# Patient Record
Sex: Female | Born: 1960
Health system: Southern US, Community
[De-identification: ages and names within clinical notes are randomized; demographics above are authoritative.]

## PROBLEM LIST (undated history)

## (undated) DIAGNOSIS — Z9889 Other specified postprocedural states: Secondary | ICD-10-CM

## (undated) DIAGNOSIS — M1990 Unspecified osteoarthritis, unspecified site: Secondary | ICD-10-CM

## (undated) DIAGNOSIS — R112 Nausea with vomiting, unspecified: Secondary | ICD-10-CM

## (undated) DIAGNOSIS — IMO0002 Reserved for concepts with insufficient information to code with codable children: Secondary | ICD-10-CM

## (undated) DIAGNOSIS — Z789 Other specified health status: Secondary | ICD-10-CM

## (undated) HISTORY — PX: BREAST SURGERY: SHX581

## (undated) HISTORY — PX: COLONOSCOPY: SHX174

---

## 1999-05-30 ENCOUNTER — Other Ambulatory Visit: Admission: RE | Admit: 1999-05-30 | Discharge: 1999-05-30 | Payer: Self-pay | Admitting: Gynecology

## 2000-08-10 ENCOUNTER — Other Ambulatory Visit: Admission: RE | Admit: 2000-08-10 | Discharge: 2000-08-10 | Payer: Self-pay | Admitting: Gynecology

## 2001-08-04 ENCOUNTER — Other Ambulatory Visit: Admission: RE | Admit: 2001-08-04 | Discharge: 2001-08-04 | Payer: Self-pay | Admitting: Gynecology

## 2002-12-15 ENCOUNTER — Other Ambulatory Visit: Admission: RE | Admit: 2002-12-15 | Discharge: 2002-12-15 | Payer: Self-pay | Admitting: Gynecology

## 2003-06-20 ENCOUNTER — Emergency Department (HOSPITAL_COMMUNITY): Admission: EM | Admit: 2003-06-20 | Discharge: 2003-06-20 | Payer: Self-pay | Admitting: Emergency Medicine

## 2003-09-04 ENCOUNTER — Encounter: Admission: RE | Admit: 2003-09-04 | Discharge: 2003-09-04 | Payer: Self-pay | Admitting: Gynecology

## 2004-06-25 ENCOUNTER — Other Ambulatory Visit: Admission: RE | Admit: 2004-06-25 | Discharge: 2004-06-25 | Payer: Self-pay | Admitting: Gynecology

## 2005-07-11 ENCOUNTER — Other Ambulatory Visit: Admission: RE | Admit: 2005-07-11 | Discharge: 2005-07-11 | Payer: Self-pay | Admitting: Gynecology

## 2005-12-09 ENCOUNTER — Encounter: Admission: RE | Admit: 2005-12-09 | Discharge: 2005-12-09 | Payer: Self-pay | Admitting: Gynecology

## 2006-09-15 ENCOUNTER — Other Ambulatory Visit: Admission: RE | Admit: 2006-09-15 | Discharge: 2006-09-15 | Payer: Self-pay | Admitting: Gynecology

## 2007-01-06 ENCOUNTER — Encounter: Admission: RE | Admit: 2007-01-06 | Discharge: 2007-01-06 | Payer: Self-pay | Admitting: Gynecology

## 2007-02-01 ENCOUNTER — Emergency Department (HOSPITAL_COMMUNITY): Admission: EM | Admit: 2007-02-01 | Discharge: 2007-02-02 | Payer: Self-pay | Admitting: Emergency Medicine

## 2008-04-19 ENCOUNTER — Encounter: Admission: RE | Admit: 2008-04-19 | Discharge: 2008-04-19 | Payer: Self-pay | Admitting: Gynecology

## 2008-04-26 ENCOUNTER — Encounter: Admission: RE | Admit: 2008-04-26 | Discharge: 2008-04-26 | Payer: Self-pay | Admitting: Gynecology

## 2008-10-23 ENCOUNTER — Encounter: Admission: RE | Admit: 2008-10-23 | Discharge: 2008-10-23 | Payer: Self-pay | Admitting: Gynecology

## 2009-01-01 ENCOUNTER — Ambulatory Visit: Payer: Self-pay | Admitting: Vascular Surgery

## 2009-01-16 ENCOUNTER — Ambulatory Visit: Payer: Self-pay | Admitting: Vascular Surgery

## 2009-06-21 ENCOUNTER — Encounter: Admission: RE | Admit: 2009-06-21 | Discharge: 2009-06-21 | Payer: Self-pay | Admitting: Gynecology

## 2010-06-24 ENCOUNTER — Encounter: Admission: RE | Admit: 2010-06-24 | Discharge: 2010-06-24 | Payer: Self-pay | Admitting: Gynecology

## 2010-09-08 HISTORY — PX: CERVICAL FUSION: SHX112

## 2010-09-29 ENCOUNTER — Encounter: Payer: Self-pay | Admitting: Gynecology

## 2010-10-14 ENCOUNTER — Encounter (HOSPITAL_COMMUNITY)
Admission: RE | Admit: 2010-10-14 | Discharge: 2010-10-14 | Disposition: A | Discharge status: Home / Self Care | Payer: 59 | Source: Ambulatory Visit | Attending: Orthopaedic Surgery | Admitting: Orthopaedic Surgery

## 2010-10-14 ENCOUNTER — Ambulatory Visit (HOSPITAL_COMMUNITY)
Admission: RE | Admit: 2010-10-14 | Discharge: 2010-10-14 | Disposition: A | Discharge status: Home / Self Care | Payer: 59 | Source: Ambulatory Visit | Attending: Orthopaedic Surgery | Admitting: Orthopaedic Surgery

## 2010-10-14 ENCOUNTER — Other Ambulatory Visit (HOSPITAL_COMMUNITY): Payer: Self-pay | Admitting: Orthopaedic Surgery

## 2010-10-14 DIAGNOSIS — M47812 Spondylosis without myelopathy or radiculopathy, cervical region: Secondary | ICD-10-CM | POA: Insufficient documentation

## 2010-10-14 DIAGNOSIS — Z01811 Encounter for preprocedural respiratory examination: Secondary | ICD-10-CM | POA: Insufficient documentation

## 2010-10-14 LAB — COMPREHENSIVE METABOLIC PANEL
ALT: 28 U/L (ref 0–35)
AST: 22 U/L (ref 0–37)
Albumin: 4.1 g/dL (ref 3.5–5.2)
Alkaline Phosphatase: 44 U/L (ref 39–117)
BUN: 9 mg/dL (ref 6–23)
CO2: 28 mEq/L (ref 19–32)
Calcium: 9.4 mg/dL (ref 8.4–10.5)
Chloride: 110 mEq/L (ref 96–112)
Creatinine, Ser: 0.75 mg/dL (ref 0.4–1.2)
GFR calc Af Amer: >60 mL/min (ref >60)
GFR calc non Af Amer: >60 mL/min (ref >60)
Glucose, Bld: 100 mg/dL — ABNORMAL HIGH (ref 70–99)
Potassium: 4.6 mEq/L (ref 3.5–5.1)
Sodium: 143 mEq/L (ref 135–145)
Total Bilirubin: 0.2 mg/dL — ABNORMAL LOW (ref 0.3–1.2)
Total Protein: 6.8 g/dL (ref 6.0–8.3)

## 2010-10-14 LAB — CBC
HCT: 39.7 % (ref 36.0–46.0)
Hemoglobin: 14.2 g/dL (ref 12.0–15.0)
MCH: 32.4 pg (ref 26.0–34.0)
MCHC: 35.8 g/dL (ref 30.0–36.0)
MCV: 90.6 fL (ref 78.0–100.0)
Platelets: 207 10*3/uL (ref 150–400)
RBC: 4.38 MIL/uL (ref 3.87–5.11)
RDW: 11.2 % — ABNORMAL LOW (ref 11.5–15.5)
WBC: 3.8 10*3/uL — ABNORMAL LOW (ref 4.0–10.5)

## 2010-10-14 LAB — DIFFERENTIAL
Basophils Absolute: 0 10*3/uL (ref 0.0–0.1)
Basophils Relative: 1 % (ref 0–1)
Eosinophils Absolute: 0.1 10*3/uL (ref 0.0–0.7)
Eosinophils Relative: 3 % (ref 0–5)
Lymphocytes Relative: 52 % — ABNORMAL HIGH (ref 12–46)
Lymphs Abs: 2 10*3/uL (ref 0.7–4.0)
Monocytes Absolute: 0.3 10*3/uL (ref 0.1–1.0)
Monocytes Relative: 7 % (ref 3–12)
Neutro Abs: 1.5 10*3/uL — ABNORMAL LOW (ref 1.7–7.7)
Neutrophils Relative %: 38 % — ABNORMAL LOW (ref 43–77)

## 2010-10-14 LAB — SURGICAL PCR SCREEN
MRSA, PCR: NEGATIVE
Staphylococcus aureus: NEGATIVE

## 2010-10-14 LAB — PROTIME-INR
INR: 0.89 (ref 0.00–1.49)
Prothrombin Time: 12.3 seconds (ref 11.6–15.2)

## 2010-10-16 ENCOUNTER — Ambulatory Visit (HOSPITAL_COMMUNITY): Payer: 59

## 2010-10-16 ENCOUNTER — Observation Stay (HOSPITAL_COMMUNITY)
Admission: RE | Admit: 2010-10-16 | Discharge: 2010-10-17 | Disposition: A | Discharge status: Home / Self Care | Payer: 59 | Source: Ambulatory Visit | Attending: Orthopaedic Surgery | Admitting: Orthopaedic Surgery

## 2010-10-16 DIAGNOSIS — Z87891 Personal history of nicotine dependence: Secondary | ICD-10-CM | POA: Insufficient documentation

## 2010-10-16 DIAGNOSIS — M47812 Spondylosis without myelopathy or radiculopathy, cervical region: Principal | ICD-10-CM | POA: Insufficient documentation

## 2010-10-16 DIAGNOSIS — F411 Generalized anxiety disorder: Secondary | ICD-10-CM | POA: Insufficient documentation

## 2010-10-16 DIAGNOSIS — Z01818 Encounter for other preprocedural examination: Secondary | ICD-10-CM | POA: Insufficient documentation

## 2010-10-22 NOTE — Op Note (Signed)
NAME:  Kathy Mcdaniel, Kathy Mcdaniel NO.:  [id number]  MEDICAL RECORD NO.:  [id number]           PATIENT TYPE:  I  LOCATION:  5002                         FACILITY:  [facility]  PHYSICIAN:  Mark C. Ophelia Charter, M.D.    DATE OF BIRTH:  21-Jun-1961  DATE OF PROCEDURE:  10/16/2010 DATE OF DISCHARGE:                              OPERATIVE REPORT   PREOPERATIVE DIAGNOSIS:  Cervical spondylosis with disk protrusion at C4- 5, C5-6.  POSTOPERATIVE DIAGNOSIS:  Cervical spondylosis with disk protrusion at C4-5, C5-6.  PROCEDURE:  C4-5, C5-6 anterior cervical diskectomy and fusion, allograft and plate.  ASSISTANT:  Maud Deed, PA-C  ANESTHESIA:  GOT plus Marcaine local.  DRAINS:  One Hemovac neck.  PROCEDURE:  After induction of general anesthesia orotracheal intubation, standard prepping and draping with horseshoe head halter traction without weight, DuraPrep area squared with towels, sterile skin marker, Betadine Steri-Strips, thyroid sheets and drapes was in the sterile Mayo standard head.  Time-out procedure was completed.  Incision was made starting at the midline and extending to the left, platysma fibers were divided in line with the skin incision.  Blunt dissection down longus coli muscles was performed and short 25 needle placed at the C5-6 interspace confirming that was appropriate level.  Self-retaining Cloward retractors were placed.  Sharp blades right and left, smooth blades up and down, diskectomy was performed with 15 scalpel blade Cloward curettes.  Operative microscope was draped and brought in and disk was removed back to the posterior longitudinal ligament.  Endplates were curetted, bur was used briefly.  There was some hypermobility as the disk was removed.  The anterior aspect of the disk space wanted to collapse.  It was held open with a T elevator, posterior ligaments were removed.  Dura was decompressed.  There was spurs overhanging more on the left side and  some disk extrusion slightly underneath the C6 vertebrate on the left side.  Uncovertebral joints were stripped sizing showed a 6-mm lordotic graft fit appropriately and it was inserted.  Self-retaining blades were removed at the C4-5 level.  The procedure was repeated.  Similarly disk protrusion at this level ligaments removed, spurs more on the left than right, where she had foraminal narrowing. Sizing showed a 7-mm lordotic graft fit appropriately.  The patient had lax neck and despite positioning of the head holder back, she maintained slight kyphosis similar to preoperative x-ray.  Some extra lordosis was then into the plate to help with this and some additional spurs removed off the C6 vertebrate so the plate would sit down after bent.  Fluffs were inserted and had been marked in the midline and plate was placed, checked under fluoroscopy carefully adjusted into a center screws were sequentially tightened down alternating to made with right down flat then insertion with the screws above and below.  The spinal spot pictures were taken for documentation.  Hemovac was placed in-and- out technique in line with skin incision.  The wound was closed with 3-0 on the platysma and 4-0 subcuticular closure.  Tincture of Benzoin, Steri-Strips, 4x4's, tape, and soft cervical collar.  A time-out procedure was  complete at the end of the case.     Mark C. Ophelia Charter, M.D.     MCY/MEDQ  D:  10/16/2010  T:  10/17/2010  Job:  [id number]  Electronically Signed by Annell Greening M.D. on 10/22/2010 04:59:11 PM

## 2011-01-21 NOTE — Consult Note (Signed)
NEW PATIENT CONSULTATION   Kathy Mcdaniel, Kathy Mcdaniel  DOB:  24-Dec-1960                                       01/01/2009  CHART#:[id number]   The patient is a 50 year old female referred for evaluation of spider  vein treatment.  She has had previous treatments for spider veins in the  past but has had recurrence.  She has some aching discomfort in both  lower legs and distal thighs which has been present over the last few  years but worsened.  She has no history of swelling, bleeding,  ulceration, thrombophlebitis, deep venous thrombosis but has had some  previous sclerotherapy performed in these spider veins with acceptable  results.  She does not wear elastic compression stockings, elevate the  legs or take pain medication.   PAST MEDICAL HISTORY:  Negative for diabetes, hypertension, coronary  artery disease, COPD or stroke.  She does have a history of herniated  disk her neck followed by Dr. Annell Greening.   PAST SURGICAL HISTORY:  Removal of a cyst in the left breast.   FAMILY HISTORY:  Unremarkable, with negative for coronary artery  disease, diabetes or stroke.   SOCIAL HISTORY:  She is married and works as an Designer, television/film set.  Quit smoking  in May 2008.  Does not use alcohol.   REVIEW OF SYSTEMS:  Unremarkable with the exception of chronic  constipation, joint pain and muscle pain.   ALLERGIES:  Ibuprofen, shellfish.   PHYSICAL EXAMINATION:  Vital Signs:  Blood pressure is 112/72, heart  rate 79, respirations 14, carotid pulses are 3+.  She is alert and  oriented x3.  Neck:  No palpable adenopathy in the neck.  Chest:  Clear  to auscultation.  Cardiovascular:  Regular rhythm, no murmurs.  Abdomen:  Soft, nontender with no masses.  She has 3+ femoral, popliteal and  dorsalis pedis pulses bilaterally.  No distal edema or evidence of  chronic venous insufficiency.  No hyperpigmentation or ulceration noted.  She does have some prominent spider veins in the right thigh  medially,  laterally and anteriorly and a few on the medial thigh on the left.  No  varicosities are noted adjacent to these.   I think the best treatment would be primary sclerotherapy for these  spider veins and we will schedule her to be done in the near future at  her request.   Fayrene Fearing D. Hart Rochester, M.D.  Electronically Signed   JDL/MEDQ  D:  01/01/2009  T:  01/02/2009  Job:  [id number]

## 2011-06-27 ENCOUNTER — Other Ambulatory Visit: Payer: Self-pay | Admitting: Gynecology

## 2011-06-27 DIAGNOSIS — Z1231 Encounter for screening mammogram for malignant neoplasm of breast: Secondary | ICD-10-CM

## 2011-08-06 ENCOUNTER — Ambulatory Visit
Admission: RE | Admit: 2011-08-06 | Discharge: 2011-08-06 | Disposition: A | Discharge status: Home / Self Care | Payer: 59 | Source: Ambulatory Visit | Attending: Gynecology | Admitting: Gynecology

## 2011-08-06 DIAGNOSIS — Z1231 Encounter for screening mammogram for malignant neoplasm of breast: Secondary | ICD-10-CM

## 2012-06-29 ENCOUNTER — Other Ambulatory Visit: Payer: Self-pay | Admitting: Gynecology

## 2012-06-29 DIAGNOSIS — Z1231 Encounter for screening mammogram for malignant neoplasm of breast: Secondary | ICD-10-CM

## 2012-08-10 ENCOUNTER — Ambulatory Visit
Admission: RE | Admit: 2012-08-10 | Discharge: 2012-08-10 | Disposition: A | Discharge status: Home / Self Care | Payer: 59 | Source: Ambulatory Visit | Attending: Gynecology | Admitting: Gynecology

## 2012-08-10 DIAGNOSIS — Z1231 Encounter for screening mammogram for malignant neoplasm of breast: Secondary | ICD-10-CM

## 2012-08-11 ENCOUNTER — Other Ambulatory Visit: Payer: Self-pay | Admitting: Gynecology

## 2012-08-11 DIAGNOSIS — R928 Other abnormal and inconclusive findings on diagnostic imaging of breast: Secondary | ICD-10-CM

## 2012-08-25 ENCOUNTER — Ambulatory Visit
Admission: RE | Admit: 2012-08-25 | Discharge: 2012-08-25 | Disposition: A | Discharge status: Home / Self Care | Payer: 59 | Source: Ambulatory Visit | Attending: Gynecology | Admitting: Gynecology

## 2012-08-25 DIAGNOSIS — R928 Other abnormal and inconclusive findings on diagnostic imaging of breast: Secondary | ICD-10-CM

## 2013-03-08 HISTORY — PX: CARPAL TUNNEL WITH CUBITAL TUNNEL: SHX5608

## 2013-08-19 ENCOUNTER — Other Ambulatory Visit: Payer: Self-pay | Admitting: Family Medicine

## 2013-08-19 ENCOUNTER — Other Ambulatory Visit (HOSPITAL_COMMUNITY)
Admission: RE | Admit: 2013-08-19 | Discharge: 2013-08-19 | Disposition: A | Discharge status: Home / Self Care | Payer: 59 | Source: Ambulatory Visit | Attending: Family Medicine | Admitting: Family Medicine

## 2013-08-19 DIAGNOSIS — Z124 Encounter for screening for malignant neoplasm of cervix: Secondary | ICD-10-CM | POA: Insufficient documentation

## 2014-01-16 ENCOUNTER — Other Ambulatory Visit: Payer: Self-pay | Admitting: Orthopedic Surgery

## 2014-02-17 ENCOUNTER — Encounter (HOSPITAL_BASED_OUTPATIENT_CLINIC_OR_DEPARTMENT_OTHER): Admission: RE | Payer: Self-pay | Source: Ambulatory Visit

## 2014-02-17 ENCOUNTER — Ambulatory Visit (HOSPITAL_BASED_OUTPATIENT_CLINIC_OR_DEPARTMENT_OTHER): Admission: RE | Admit: 2014-02-17 | Payer: 59 | Source: Ambulatory Visit | Admitting: Orthopedic Surgery

## 2014-02-17 SURGERY — RELEASE, CUBITAL TUNNEL
Anesthesia: General | Laterality: Right

## 2014-04-06 ENCOUNTER — Encounter (HOSPITAL_BASED_OUTPATIENT_CLINIC_OR_DEPARTMENT_OTHER): Payer: Self-pay | Admitting: *Deleted

## 2014-04-06 NOTE — Progress Notes (Signed)
No labs needed-had other arm done last yr

## 2014-04-13 ENCOUNTER — Ambulatory Visit (HOSPITAL_BASED_OUTPATIENT_CLINIC_OR_DEPARTMENT_OTHER)
Admission: RE | Admit: 2014-04-13 | Discharge: 2014-04-13 | Disposition: A | Discharge status: Home / Self Care | Payer: 59 | Source: Ambulatory Visit | Attending: Orthopedic Surgery | Admitting: Orthopedic Surgery

## 2014-04-13 ENCOUNTER — Encounter (HOSPITAL_BASED_OUTPATIENT_CLINIC_OR_DEPARTMENT_OTHER): Payer: Self-pay | Admitting: *Deleted

## 2014-04-13 ENCOUNTER — Ambulatory Visit (HOSPITAL_BASED_OUTPATIENT_CLINIC_OR_DEPARTMENT_OTHER): Payer: 59 | Admitting: Anesthesiology

## 2014-04-13 ENCOUNTER — Encounter (HOSPITAL_BASED_OUTPATIENT_CLINIC_OR_DEPARTMENT_OTHER)
Admission: RE | Disposition: A | Discharge status: Home / Self Care | Payer: Self-pay | Source: Ambulatory Visit | Attending: Orthopedic Surgery

## 2014-04-13 ENCOUNTER — Encounter (HOSPITAL_BASED_OUTPATIENT_CLINIC_OR_DEPARTMENT_OTHER): Payer: 59 | Admitting: Anesthesiology

## 2014-04-13 DIAGNOSIS — G5620 Lesion of ulnar nerve, unspecified upper limb: Secondary | ICD-10-CM | POA: Diagnosis not present

## 2014-04-13 DIAGNOSIS — Z91040 Latex allergy status: Secondary | ICD-10-CM | POA: Insufficient documentation

## 2014-04-13 DIAGNOSIS — Z886 Allergy status to analgesic agent status: Secondary | ICD-10-CM | POA: Insufficient documentation

## 2014-04-13 DIAGNOSIS — Z91013 Allergy to seafood: Secondary | ICD-10-CM | POA: Diagnosis not present

## 2014-04-13 HISTORY — DX: Unspecified osteoarthritis, unspecified site: M19.90

## 2014-04-13 HISTORY — DX: Reserved for concepts with insufficient information to code with codable children: IMO0002

## 2014-04-13 HISTORY — DX: Other specified health status: Z78.9

## 2014-04-13 HISTORY — PX: ULNAR TUNNEL RELEASE: SHX820

## 2014-04-13 HISTORY — DX: Other specified postprocedural states: Z98.890

## 2014-04-13 HISTORY — DX: Nausea with vomiting, unspecified: R11.2

## 2014-04-13 LAB — POCT HEMOGLOBIN-HEMACUE: Hemoglobin: 12.4 g/dL (ref 12.0–15.0)

## 2014-04-13 SURGERY — RELEASE, CUBITAL TUNNEL
Anesthesia: Regional | Site: Arm Upper | Laterality: Right

## 2014-04-13 MED ORDER — LACTATED RINGERS IV SOLN
INTRAVENOUS | Status: DC
  Administered 2014-04-13 (×2): via INTRAVENOUS

## 2014-04-13 MED ORDER — ONDANSETRON HCL 4 MG/2ML IJ SOLN: 4.0000 mg | Freq: Once | INTRAMUSCULAR | Status: DC | PRN

## 2014-04-13 MED ORDER — FENTANYL CITRATE 0.05 MG/ML IJ SOLN
INTRAMUSCULAR | Status: AC
  Filled 2014-04-13: qty 4

## 2014-04-13 MED ORDER — PROPOFOL 10 MG/ML IV BOLUS
INTRAVENOUS | Status: DC | PRN
  Administered 2014-04-13: 200 mg via INTRAVENOUS

## 2014-04-13 MED ORDER — MEPERIDINE HCL 25 MG/ML IJ SOLN: 6.2500 mg | INTRAMUSCULAR | Status: DC | PRN

## 2014-04-13 MED ORDER — GLYCOPYRROLATE 0.2 MG/ML IJ SOLN
INTRAMUSCULAR | Status: DC | PRN
  Administered 2014-04-13: 0.2 mg via INTRAVENOUS

## 2014-04-13 MED ORDER — SCOPOLAMINE 1 MG/3DAYS TD PT72
1.0000 | MEDICATED_PATCH | TRANSDERMAL | Status: DC
  Administered 2014-04-13: 1.5 mg via TRANSDERMAL

## 2014-04-13 MED ORDER — OXYCODONE HCL 5 MG/5ML PO SOLN: 5.0000 mg | Freq: Once | ORAL | Status: DC | PRN

## 2014-04-13 MED ORDER — BUPIVACAINE-EPINEPHRINE (PF) 0.5% -1:200000 IJ SOLN
INTRAMUSCULAR | Status: DC | PRN
  Administered 2014-04-13: 30 mL via PERINEURAL

## 2014-04-13 MED ORDER — MIDAZOLAM HCL 2 MG/2ML IJ SOLN
INTRAMUSCULAR | Status: AC
  Filled 2014-04-13: qty 2

## 2014-04-13 MED ORDER — SCOPOLAMINE 1 MG/3DAYS TD PT72
MEDICATED_PATCH | TRANSDERMAL | Status: AC
  Filled 2014-04-13: qty 1

## 2014-04-13 MED ORDER — MIDAZOLAM HCL 2 MG/2ML IJ SOLN
1.0000 mg | INTRAMUSCULAR | Status: DC | PRN
  Administered 2014-04-13: 2 mg via INTRAVENOUS

## 2014-04-13 MED ORDER — FENTANYL CITRATE 0.05 MG/ML IJ SOLN
INTRAMUSCULAR | Status: AC
  Filled 2014-04-13: qty 2

## 2014-04-13 MED ORDER — LIDOCAINE HCL (CARDIAC) 20 MG/ML IV SOLN
INTRAVENOUS | Status: DC | PRN
Start: 2014-04-13 — End: 2014-04-13
  Administered 2014-04-13: 100 mg via INTRAVENOUS

## 2014-04-13 MED ORDER — CEFAZOLIN SODIUM-DEXTROSE 2-3 GM-% IV SOLR
INTRAVENOUS | Status: AC
  Filled 2014-04-13: qty 50

## 2014-04-13 MED ORDER — ONDANSETRON HCL 4 MG/2ML IJ SOLN
INTRAMUSCULAR | Status: DC | PRN
  Administered 2014-04-13: 4 mg via INTRAVENOUS

## 2014-04-13 MED ORDER — HYDROMORPHONE HCL PF 1 MG/ML IJ SOLN: 0.2500 mg | INTRAMUSCULAR | Status: DC | PRN

## 2014-04-13 MED ORDER — DEXAMETHASONE SODIUM PHOSPHATE 4 MG/ML IJ SOLN
INTRAMUSCULAR | Status: DC | PRN
  Administered 2014-04-13: 10 mg via INTRAVENOUS

## 2014-04-13 MED ORDER — OXYCODONE HCL 5 MG PO TABS: 5.0000 mg | ORAL_TABLET | ORAL | Status: DC | PRN

## 2014-04-13 MED ORDER — OXYCODONE HCL 5 MG PO TABS: 5.0000 mg | ORAL_TABLET | Freq: Once | ORAL | Status: DC | PRN

## 2014-04-13 MED ORDER — ONDANSETRON HCL 4 MG/2ML IJ SOLN: INTRAMUSCULAR | Status: DC | PRN

## 2014-04-13 MED ORDER — CEFAZOLIN SODIUM-DEXTROSE 2-3 GM-% IV SOLR
2.0000 g | INTRAVENOUS | Status: AC
  Administered 2014-04-13: 2 g via INTRAVENOUS

## 2014-04-13 MED ORDER — LACTATED RINGERS IV SOLN: INTRAVENOUS | Status: DC

## 2014-04-13 MED ORDER — METHOCARBAMOL 500 MG PO TABS: 500.0000 mg | ORAL_TABLET | Freq: Four times a day (QID) | ORAL | Status: DC

## 2014-04-13 MED ORDER — CHLORHEXIDINE GLUCONATE 4 % EX LIQD
60.0000 mL | Freq: Once | CUTANEOUS | Status: DC
Start: 2014-04-13 — End: 2014-04-13

## 2014-04-13 MED ORDER — FENTANYL CITRATE 0.05 MG/ML IJ SOLN
50.0000 ug | INTRAMUSCULAR | Status: DC | PRN
  Administered 2014-04-13: 100 ug via INTRAVENOUS

## 2014-04-13 SURGICAL SUPPLY — 76 items
BANDAGE ELASTIC 3 VELCRO ST LF (GAUZE/BANDAGES/DRESSINGS) IMPLANT
BANDAGE ELASTIC 4 VELCRO ST LF (GAUZE/BANDAGES/DRESSINGS) ×3 IMPLANT
BANDAGE ELASTIC 6 VELCRO ST LF (GAUZE/BANDAGES/DRESSINGS) IMPLANT
BLADE CLIPPER SURG (BLADE) IMPLANT
BLADE SURG 15 STRL LF DISP TIS (BLADE) ×3 IMPLANT
BLADE SURG 15 STRL SS (BLADE) ×6
BNDG CONFORM 3 STRL LF (GAUZE/BANDAGES/DRESSINGS) ×3 IMPLANT
BNDG GAUZE ELAST 4 BULKY (GAUZE/BANDAGES/DRESSINGS) ×6 IMPLANT
BRUSH SCRUB EZ PLAIN DRY (MISCELLANEOUS) ×3 IMPLANT
CANISTER SUCT 1200ML W/VALVE (MISCELLANEOUS) IMPLANT
CLOSURE WOUND 1/2 X4 (GAUZE/BANDAGES/DRESSINGS)
CORDS BIPOLAR (ELECTRODE) ×3 IMPLANT
COVER MAYO STAND STRL (DRAPES) ×3 IMPLANT
COVER TABLE BACK 60X90 (DRAPES) ×3 IMPLANT
CUFF TOURNIQUET SINGLE 18IN (TOURNIQUET CUFF) ×3 IMPLANT
DECANTER SPIKE VIAL GLASS SM (MISCELLANEOUS) IMPLANT
DRAPE EXTREMITY T 121X128X90 (DRAPE) ×3 IMPLANT
DRAPE INCISE IOBAN 66X45 STRL (DRAPES) IMPLANT
DRAPE SURG 17X23 STRL (DRAPES) ×3 IMPLANT
DRSG EMULSION OIL 3X3 NADH (GAUZE/BANDAGES/DRESSINGS) ×3 IMPLANT
EVACUATOR 1/8 PVC DRAIN (DRAIN) IMPLANT
EVACUATOR 3/16  PVC DRAIN (DRAIN)
EVACUATOR 3/16 PVC DRAIN (DRAIN) IMPLANT
EVACUATOR SILICONE 100CC (DRAIN) IMPLANT
GAUZE SPONGE 4X4 12PLY STRL (GAUZE/BANDAGES/DRESSINGS) ×3 IMPLANT
GAUZE SPONGE 4X4 16PLY XRAY LF (GAUZE/BANDAGES/DRESSINGS) IMPLANT
GAUZE XEROFORM 1X8 LF (GAUZE/BANDAGES/DRESSINGS) ×6 IMPLANT
GLOVE BIO SURGEON STRL SZ8 (GLOVE) IMPLANT
GLOVE BIOGEL M STRL SZ7.5 (GLOVE) IMPLANT
GLOVE BIOGEL PI IND STRL 8 (GLOVE) ×2 IMPLANT
GLOVE BIOGEL PI INDICATOR 8 (GLOVE) ×4
GLOVE SS BIOGEL STRL SZ 8 (GLOVE) IMPLANT
GLOVE SUPERSENSE BIOGEL SZ 8 (GLOVE)
GLOVE SURG SS PI 7.0 STRL IVOR (GLOVE) ×6 IMPLANT
GLOVE SURG SS PI 7.5 STRL IVOR (GLOVE) ×3 IMPLANT
GLOVE SURG SS PI 8.0 STRL IVOR (GLOVE) ×3 IMPLANT
GOWN STRL REUS W/ TWL LRG LVL3 (GOWN DISPOSABLE) ×1 IMPLANT
GOWN STRL REUS W/ TWL XL LVL3 (GOWN DISPOSABLE) ×2 IMPLANT
GOWN STRL REUS W/TWL LRG LVL3 (GOWN DISPOSABLE) ×2
GOWN STRL REUS W/TWL XL LVL3 (GOWN DISPOSABLE) ×4
LOOP VESSEL MAXI BLUE (MISCELLANEOUS) IMPLANT
NEEDLE HYPO 22GX1.5 SAFETY (NEEDLE) IMPLANT
NEEDLE HYPO 25X1 1.5 SAFETY (NEEDLE) IMPLANT
NS IRRIG 1000ML POUR BTL (IV SOLUTION) ×3 IMPLANT
PACK BASIN DAY SURGERY FS (CUSTOM PROCEDURE TRAY) ×3 IMPLANT
PAD CAST 3X4 CTTN HI CHSV (CAST SUPPLIES) ×1 IMPLANT
PADDING CAST ABS 4INX4YD NS (CAST SUPPLIES) ×4
PADDING CAST ABS COTTON 4X4 ST (CAST SUPPLIES) ×2 IMPLANT
PADDING CAST COTTON 3X4 STRL (CAST SUPPLIES) ×2
SHEET MEDIUM DRAPE 40X70 STRL (DRAPES) IMPLANT
SPLINT FAST PLASTER 5X30 (CAST SUPPLIES)
SPLINT FIBERGLASS 3X35 (CAST SUPPLIES) ×3 IMPLANT
SPLINT PLASTER CAST FAST 5X30 (CAST SUPPLIES) IMPLANT
SPLINT PLASTER CAST XFAST 3X15 (CAST SUPPLIES) IMPLANT
SPLINT PLASTER XTRA FASTSET 3X (CAST SUPPLIES)
SPONGE LAP 4X18 X RAY DECT (DISPOSABLE) IMPLANT
STOCKINETTE 4X48 STRL (DRAPES) ×3 IMPLANT
STOCKINETTE SYNTHETIC 3 UNSTER (CAST SUPPLIES) ×3 IMPLANT
STRIP CLOSURE SKIN 1/2X4 (GAUZE/BANDAGES/DRESSINGS) IMPLANT
SUCTION FRAZIER TIP 10 FR DISP (SUCTIONS) IMPLANT
SUT BONE WAX W31G (SUTURE) IMPLANT
SUT FIBERWIRE 2-0 18 17.9 3/8 (SUTURE)
SUT PROLENE 4 0 PS 2 18 (SUTURE) ×12 IMPLANT
SUT VIC AB 3-0 FS2 27 (SUTURE) IMPLANT
SUT VIC AB 4-0 P-3 18XBRD (SUTURE) IMPLANT
SUT VIC AB 4-0 P3 18 (SUTURE)
SUTURE FIBERWR 2-0 18 17.9 3/8 (SUTURE) IMPLANT
SYR BULB 3OZ (MISCELLANEOUS) ×3 IMPLANT
SYR CONTROL 10ML LL (SYRINGE) IMPLANT
TAPE SURG TRANSPORE 1 IN (GAUZE/BANDAGES/DRESSINGS) IMPLANT
TAPE SURGICAL TRANSPORE 1 IN (GAUZE/BANDAGES/DRESSINGS)
TOWEL OR 17X24 6PK STRL BLUE (TOWEL DISPOSABLE) ×6 IMPLANT
TOWEL OR NON WOVEN STRL DISP B (DISPOSABLE) IMPLANT
TUBE CONNECTING 20'X1/4 (TUBING)
TUBE CONNECTING 20X1/4 (TUBING) IMPLANT
UNDERPAD 30X30 INCONTINENT (UNDERPADS AND DIAPERS) ×3 IMPLANT

## 2014-04-13 NOTE — Anesthesia Procedure Notes (Addendum)
Anesthesia Regional Block:  Supraclavicular block  Pre-Anesthetic Checklist: ,, timeout performed, Correct Patient, Correct Site, Correct Laterality, Correct Procedure, Correct Position, site marked, Risks and benefits discussed,  Surgical consent,  Pre-op evaluation,  At surgeon's request and post-op pain management  Laterality: Right  Prep: chloraprep       Needles:   Needle Type: Echogenic Stimulator Needle     Needle Length: 9cm 9 cm Needle Gauge: 21 and 21 G    Additional Needles:  Procedures: ultrasound guided (picture in chart) and nerve stimulator Supraclavicular block  Nerve Stimulator or Paresthesia:  Response: 0.4 mA,   Additional Responses:   Narrative:  Start time: 04/13/2014 1:35 PM End time: 04/13/2014 1:50 PM Injection made incrementally with aspirations every 5 mL.  Performed by: Personally  Anesthesiologist: Arta BruceKevin Ossey MD  Additional Notes: Monitors applied. Patient sedated. Sterile prep and drape,hand hygiene and sterile gloves were used. Relevant anatomy identified.Needle position confirmed.Local anesthetic injected incrementally after negative aspiration. Local anesthetic spread visualized around nerve(s). Vascular puncture avoided. No complications. Image printed for medical record.The patient tolerated the procedure well.        Procedure Name: LMA Insertion Date/Time: 04/13/2014 2:58 PM Performed by: Gar GibbonKEETON, Shandricka Monroy S Pre-anesthesia Checklist: Patient identified, Emergency Drugs available, Suction available and Patient being monitored Patient Re-evaluated:Patient Re-evaluated prior to inductionOxygen Delivery Method: Circle System Utilized Preoxygenation: Pre-oxygenation with 100% oxygen Intubation Type: IV induction Ventilation: Mask ventilation without difficulty LMA: LMA inserted LMA Size: 4.0 Number of attempts: 1 Airway Equipment and Method: bite block Placement Confirmation: positive ETCO2 Tube secured with: Tape Dental Injury: Teeth and  Oropharynx as per pre-operative assessment

## 2014-04-13 NOTE — Anesthesia Postprocedure Evaluation (Signed)
Anesthesia Post Note  Patient: Kathy Mcdaniel  Procedure(s) Performed: Procedure(s) (LRB): RIGHT CUBITAL TUNNEL RELEASE INSITU VS TRANS POSTION RIGHT ON THE NERVE RELEASE AT WRIST GUYONS CANAL (Right)  Anesthesia type: general  Patient location: PACU  Post pain: Pain level controlled  Post assessment: Patient's Cardiovascular Status Stable  Last Vitals:  Filed Vitals:   04/13/14 1630  BP: 120/85  Pulse: 55  Temp:   Resp: 14    Post vital signs: Reviewed and stable  Level of consciousness: sedated  Complications: No apparent anesthesia complications

## 2014-04-13 NOTE — H&P (Signed)
Kathy Mcdaniel is an 53 y.o. female.   Chief Complaint: Patient presents for right ulnar nerve release at the elbow and wrist HPI: Patient presents for right ulnar nerve release at the elbow and wrist.  Past Medical History  Diagnosis Date  . Medical history non-contributory   . Arthritis   . DDD (degenerative disc disease)   . PONV (postoperative nausea and vomiting)     Past Surgical History  Procedure Laterality Date  . Cervical fusion  2012  . Breast surgery      lt br cyst  . Carpal tunnel with cubital tunnel  7/14    left  . Colonoscopy      History reviewed. No pertinent family history. Social History:  reports that she quit smoking about 7 years ago. She does not have any smokeless tobacco history on file. She reports that she does not drink alcohol or use illicit drugs.  Allergies:  Allergies  Allergen Reactions  . Ibuprofen Hives  . Shellfish Allergy Hives  . Latex Rash    Medications Prior to Admission  Medication Sig Dispense Refill  . b complex vitamins capsule Take 1 capsule by mouth daily.      . cholecalciferol (VITAMIN D) 1000 UNITS tablet Take 1,000 Units by mouth daily.      Marland Kitchen. estradiol (VIVELLE-DOT) 0.025 MG/24HR Place 1 patch onto the skin 2 (two) times a week.      . Omega-3 Fatty Acids (FISH OIL) 1000 MG CAPS Take by mouth.        Results for orders placed during the hospital encounter of 04/13/14 (from the past 48 hour(s))  POCT HEMOGLOBIN-HEMACUE     Status: None   Collection Time    04/13/14  1:57 PM      Result Value Ref Range   Hemoglobin 12.4  12.0 - 15.0 g/dL   No results found.  Review of Systems  Eyes: Negative.   Respiratory: Negative.   Cardiovascular: Negative.   Gastrointestinal: Negative.   Neurological: Negative.   Psychiatric/Behavioral: Negative.     Blood pressure 96/45, pulse 48, temperature 98.5 F (36.9 C), temperature source Oral, resp. rate 16, height 5\' 10"  (1.778 m), weight 60.328 kg (133 lb), SpO2  100.00%. Physical Exam  Cubital tunnel syndrome and ulnar nerve compression at Guyon's canal is appreciated on exam.  Positive Tinel's ulnar nerve compression test and intact vascular exam is notable  The patient is alert and oriented in no acute distress the patient complains of pain in the affected upper extremity.  The patient is noted to have a normal HEENT exam.  Lung fields show equal chest expansion and no shortness of breath  abdomen exam is nontender without distention.  Lower extremity examination does not show any fracture dislocation or blood clot symptoms.  Pelvis is stable neck and back are stable and nontender Assessment/Plan We are planning surgery for your upper extremity. The risk and benefits of surgery include risk of bleeding infection anesthesia damage to normal structures and failure of the surgery to accomplish its intended goals of relieving symptoms and restoring function with this in mind we'll going to proceed. I have specifically discussed with the patient the pre-and postoperative regime and the does and don'ts and risk and benefits in great detail. Risk and benefits of surgery also include risk of dystrophy chronic nerve pain failure of the healing process to go onto completion and other inherent risks of surgery The relavent the pathophysiology of the disease/injury process, as well as  the alternatives for treatment and postoperative course of action has been discussed in great detail with the patient who desires to proceed.  We will do everything in our power to help you (the patient) restore function to the upper extremity. Is a pleasure to see this patient today.   Karen Chafe 04/13/2014, 2:37 PM

## 2014-04-13 NOTE — Transfer of Care (Signed)
Immediate Anesthesia Transfer of Care Note  Patient: Kathy Mcdaniel  Procedure(s) Performed: Procedure(s) with comments: RIGHT CUBITAL TUNNEL RELEASE INSITU VS TRANS POSTION RIGHT ON THE NERVE RELEASE AT WRIST GUYONS CANAL (Right) - at elbow and at hand  Patient Location: PACU  Anesthesia Type:GA combined with regional for post-op pain  Level of Consciousness: awake and alert   Airway & Oxygen Therapy: Patient Spontanous Breathing and Patient connected to face mask oxygen  Post-op Assessment: Report given to PACU RN, Post -op Vital signs reviewed and stable and Patient moving all extremities  Post vital signs: Reviewed and stable  Complications: No apparent anesthesia complications

## 2014-04-13 NOTE — Anesthesia Preprocedure Evaluation (Signed)
Anesthesia Evaluation  Patient identified by MRN, date of birth, ID band Patient awake    Reviewed: Allergy & Precautions, H&P , NPO status , Patient's Chart, lab work & pertinent test results  History of Anesthesia Complications (+) PONV  Airway Mallampati: I TM Distance: >3 FB Neck ROM: Full    Dental   Pulmonary former smoker,          Cardiovascular     Neuro/Psych    GI/Hepatic   Endo/Other    Renal/GU      Musculoskeletal   Abdominal   Peds  Hematology   Anesthesia Other Findings   Reproductive/Obstetrics                           Anesthesia Physical Anesthesia Plan  ASA: II  Anesthesia Plan: General   Post-op Pain Management:    Induction: Intravenous  Airway Management Planned: LMA  Additional Equipment:   Intra-op Plan:   Post-operative Plan: Extubation in OR  Informed Consent: I have reviewed the patients History and Physical, chart, labs and discussed the procedure including the risks, benefits and alternatives for the proposed anesthesia with the patient or authorized representative who has indicated his/her understanding and acceptance.     Plan Discussed with: CRNA and Surgeon  Anesthesia Plan Comments:         Anesthesia Quick Evaluation

## 2014-04-13 NOTE — Progress Notes (Signed)
Assisted Dr. Ossey with right, ultrasound guided, supraclavicular block. Side rails up, monitors on throughout procedure. See vital signs in flow sheet. Tolerated Procedure well. 

## 2014-04-13 NOTE — Discharge Instructions (Addendum)
We recommend that you to take vitamin C 1000 mg a day to promote healing we also recommend that if you require her pain medicine that he take a stool softener to prevent constipation as most pain medicines will have constipation side effects. We recommend either Peri-Colace or Senokot and recommend that you also consider adding MiraLAX to prevent the constipation affects from pain medicine if you are required to use them. These medicines are over the counter and maybe purchased at a local pharmacy. ° ° °Keep bandage clean and dry.  Call for any problems.  No smoking.  Criteria for driving a car: you should be off your pain medicine for 7-8 hours, able to drive one handed(confident), thinking clearly and feeling able in your judgement to drive. °Continue elevation as it will decrease swelling.  If instructed by MD move your fingers within the confines of the bandage/splint.  Use ice if instructed by your MD. Call immediately for any sudden loss of feeling in your hand/arm or change in functional abilities of the extremity. ° ° °Post Anesthesia Home Care Instructions ° °Activity: °Get plenty of rest for the remainder of the day. A responsible adult should stay with you for 24 hours following the procedure.  °For the next 24 hours, DO NOT: °-Drive a car °-Operate machinery °-Drink alcoholic beverages °-Take any medication unless instructed by your physician °-Make any legal decisions or sign important papers. ° °Meals: °Start with liquid foods such as gelatin or soup. Progress to regular foods as tolerated. Avoid greasy, spicy, heavy foods. If nausea and/or vomiting occur, drink only clear liquids until the nausea and/or vomiting subsides. Call your physician if vomiting continues. ° °Special Instructions/Symptoms: °Your throat may feel dry or sore from the anesthesia or the breathing tube placed in your throat during surgery. If this causes discomfort, gargle with warm salt water. The discomfort should disappear  within 24 hours. ° °

## 2014-04-13 NOTE — Op Note (Signed)
See [id number]#[id number] Amanda PeaGramig Md

## 2014-04-14 ENCOUNTER — Encounter (HOSPITAL_BASED_OUTPATIENT_CLINIC_OR_DEPARTMENT_OTHER): Payer: Self-pay | Admitting: Orthopedic Surgery

## 2014-04-14 NOTE — Op Note (Signed)
NAMSabino Mcdaniel:  Michalsky, DIANE                 ACCOUNT NO.:  [id number]  MEDICAL RECORD NO.:  [id number]  LOCATION:                                 FACILITY:  PHYSICIAN:  Dionne AnoWilliam M. Crissie Aloi, M.D.DATE OF BIRTH:  [birth year]  DATE OF PROCEDURE:  04/13/2014 DATE OF DISCHARGE:  04/13/2014                              OPERATIVE REPORT   PREOPERATIVE DIAGNOSIS:  Right ulnar nerve compression at the elbow and wrist region.  POSTOPERATIVE DIAGNOSIS:  Right ulnar nerve compression at the elbow and wrist region.  PROCEDURES: 1. Cubital tunnel release, right elbow (ulnar nerve decompression at     the elbow, right upper extremity). 2. Ulnar nerve release at the wrist (Guyon's canal release, right     wrist and hand).  SURGEON:  Dionne AnoWilliam M. Amanda PeaGramig, M.D.  ASSISTANT:  Karie ChimeraBrian Buchanan, P.A.-C.  ANESTHESIA:  General, preoperative block.  TOURNIQUET TIME:  Less than an hour.  DRAINS:  None.  INDICATIONS:  This pleasant female, presents with above-mentioned diagnosis.  She is 53 years of age.  She has had a prior left-sided release and did wonderfully.  She presents for the above-mentioned surgical intervention.  She understands risks and benefits, do's and don'ts, timeframe duration of recovering, and other issues that they are in the remainder of upper extremity predicament.  With this in mind, she desires to proceed.  All questions have been encouraged and answered preoperatively.  OPERATIVE PROCEDURE:  The patient was seen by myself and Anesthesia, taken to the operative suite, underwent smooth induction of general anesthetic, laid supine, fully padded, prepped and draped in usual sterile fashion with Hibiclens scrub and paint.  Following this, the patient then underwent very careful and cautious draping, time-out was called.  Pre and postop check verified once again and tourniquet was insufflated to 250 mmHg.  Once this was done, posterior medial incision was made under tourniquet control, the  arcade of Struthers, medial intermuscular septum, two heads of the FCU, Osborne's ligament and cubital tunnel release.  I used a windowing technique to ensure that the ulnar nerve would not have any subluxation tendencies and it did not. The ulnar nerve set tension free, looked very well in its resting state and had excellent excursion with alleviation of any tight fascial bands throughout its course in the elbow.  This area was irrigated and closed with Prolene.  I should note the medial antebrachial cutaneous nerve branch was identified and protected at all times.  Following this, curvilinear incision was made over Guyon's canal. Dissection was carried down through the skin with knife blade.  The patient had the ulnar artery and ulnar nerve identified proximally and following this, the fascial release was accomplished.  She had tight fascial bands in the distal portion of the wrist.  We released Guyon's canal in its entirety including portions of the hypothenar musculature superficially.  I checked the pisohamate ligament to ensure there was no space-occupying lesions, and there was not.  The patient had full release accomplishing the deep motor branch and superficial nerve branches were identified.  The radial artery was kept patent and I have dissected very carefully with facial nerve dissector and scissor tip instrument.  After canal release of Guyon was complete, the patient then underwent irrigation followed by wound closure with the tourniquet deflated and hemostasis secured.  The patient was placed in a short-arm splint about the wrist and a soft dressing about the elbow.  She tolerated the procedure well.  There were no complicating features.  She was taken to the recovery room in stable condition.  We will plan for elevation, range of motion.  We will see her back in the office in 10-14 days, and asked her to notify us should any problems occur.  OxyIR and Robaxin were written  for pain and muscle spasm.  Should any problems arise, she will notify us.  It has been pleasure to participate in her care.  We look forward to participate in her postop recovery.     Dionne Ano. Amanda Pea, M.D.     [facility]  D:  04/13/2014  T:  04/14/2014  Job:  [id number]

## 2014-10-10 ENCOUNTER — Other Ambulatory Visit: Payer: Self-pay | Admitting: Obstetrics and Gynecology

## 2014-10-11 LAB — CYTOLOGY - PAP

## 2015-10-18 ENCOUNTER — Other Ambulatory Visit: Payer: Self-pay | Admitting: Obstetrics and Gynecology

## 2016-04-22 ENCOUNTER — Other Ambulatory Visit: Payer: Self-pay | Admitting: Emergency Medicine

## 2016-04-22 ENCOUNTER — Ambulatory Visit
Admission: RE | Admit: 2016-04-22 | Discharge: 2016-04-22 | Disposition: A | Discharge status: Home / Self Care | Payer: Worker's Compensation | Source: Ambulatory Visit | Attending: Emergency Medicine | Admitting: Emergency Medicine

## 2016-04-22 DIAGNOSIS — T1490XA Injury, unspecified, initial encounter: Secondary | ICD-10-CM

## 2016-04-25 ENCOUNTER — Other Ambulatory Visit: Payer: Self-pay | Admitting: Internal Medicine

## 2016-04-25 DIAGNOSIS — M549 Dorsalgia, unspecified: Secondary | ICD-10-CM

## 2016-05-02 ENCOUNTER — Other Ambulatory Visit: Payer: Worker's Compensation

## 2016-07-17 ENCOUNTER — Ambulatory Visit (INDEPENDENT_AMBULATORY_CARE_PROVIDER_SITE_OTHER): Payer: Worker's Compensation | Admitting: Orthopaedic Surgery

## 2016-07-17 ENCOUNTER — Encounter (INDEPENDENT_AMBULATORY_CARE_PROVIDER_SITE_OTHER): Payer: Self-pay | Admitting: Orthopaedic Surgery

## 2016-07-17 DIAGNOSIS — S32110A Nondisplaced Zone I fracture of sacrum, initial encounter for closed fracture: Secondary | ICD-10-CM | POA: Insufficient documentation

## 2016-07-17 DIAGNOSIS — S32110D Nondisplaced Zone I fracture of sacrum, subsequent encounter for fracture with routine healing: Secondary | ICD-10-CM

## 2016-07-17 NOTE — Progress Notes (Signed)
   Office Visit Note   Patient: Baruch MerlDianne T Anastos           Date of Birth: April 06, 1961           MRN: [id number] Visit Date: 07/17/2016              Requested by: L.Lupe Carneyean Mitchell, MD [address] [place], [place] [address] PCP: Lupe Carneyean Mitchell, MD   Assessment & Plan: Visit Diagnoses:  1. Closed nondisplaced zone I fracture of sacrum with routine healing, subsequent encounter     Plan:  - bone stimulator ordered - discussed a repeat CT or MRI to eval for fx healing and edema, I think its value is low at this time - f/u 6 weeks for recheck - continue light duty  Follow-Up Instructions: Return in about 6 weeks (around 08/28/2016).   Orders:  No orders of the defined types were placed in this encounter.  No orders of the defined types were placed in this encounter.     Procedures: No procedures performed   Clinical Data: No additional findings.   Subjective: No chief complaint on file.   Fracture followup for ND S4 and S5 fractures since august.  Continues to have pain in the buttock region.  She cannot sit on a stationary bike.  Denies any new complaints    Review of Systems   Objective: Vital Signs: There were no vitals taken for this visit.  Physical Exam  Ortho Exam TTP over lower sacral segments No swelling BLE NVI Specialty Comments:  No specialty comments available.  Imaging: No results found.   PMFS History: Patient Active Problem List   Diagnosis Date Noted  . Closed nondisplaced zone I fracture of sacrum (HCC) 07/17/2016   Past Medical History:  Diagnosis Date  . Arthritis   . DDD (degenerative disc disease)   . Medical history non-contributory   . PONV (postoperative nausea and vomiting)     No family history on file.  Past Surgical History:  Procedure Laterality Date  . BREAST SURGERY     lt br cyst  . CARPAL TUNNEL WITH CUBITAL TUNNEL  7/14   left  . CERVICAL FUSION  2012  . COLONOSCOPY    . ULNAR TUNNEL RELEASE  Right 04/13/2014   Procedure: RIGHT CUBITAL TUNNEL RELEASE INSITU VS TRANS POSTION RIGHT ON THE NERVE RELEASE AT WRIST GUYONS CANAL;  Surgeon: Dominica SeverinWilliam Gramig, MD;  Location: [facility];  Service: Orthopedics;  Laterality: Right;  at elbow and at hand   Social History   Occupational History  . Not on file.   Social History Main Topics  . Smoking status: Former Smoker    Quit date: 04/07/2007  . Smokeless tobacco: Not on file  . Alcohol use No     Comment: not since 2008  . Drug use: No  . Sexual activity: Not on file

## 2016-08-28 ENCOUNTER — Ambulatory Visit (INDEPENDENT_AMBULATORY_CARE_PROVIDER_SITE_OTHER): Payer: Worker's Compensation | Admitting: Orthopaedic Surgery

## 2016-08-28 ENCOUNTER — Encounter (INDEPENDENT_AMBULATORY_CARE_PROVIDER_SITE_OTHER): Payer: Self-pay | Admitting: Orthopaedic Surgery

## 2016-08-28 DIAGNOSIS — S32110D Nondisplaced Zone I fracture of sacrum, subsequent encounter for fracture with routine healing: Secondary | ICD-10-CM | POA: Diagnosis not present

## 2016-08-28 NOTE — Progress Notes (Signed)
   Office Visit Note   Patient: Baruch MerlDianne T Wittmeyer           Date of Birth: 1961-03-31           MRN: [id number] Visit Date: 08/28/2016              Requested by: L.Lupe Carneyean Mitchell, MD [address] [place], [place] [address] PCP: Lupe Carneyean Mitchell, MD   Assessment & Plan: Visit Diagnoses:  1. Closed nondisplaced zone I fracture of sacrum with routine healing, subsequent encounter     Plan: At this point I gave her 2 options one is to return to work on January 3 to see if she can perform full duty the other option is to perform a functional capacity evaluation after which she will likely have some permanent work restriction that may affect her with her current job or future jobs. She wishes to return to full duty to see if she is able to tolerate this. Continue using bone stimulator for another 4 weeks and then may discontinue. Follow-up as needed.  Follow-Up Instructions: Return if symptoms worsen or fail to improve.   Orders:  No orders of the defined types were placed in this encounter.  No orders of the defined types were placed in this encounter.     Procedures: No procedures performed   Clinical Data: No additional findings.   Subjective: Chief Complaint  Patient presents with  . Lower Back - Follow-up    Patient comes in today op of sacral fracture. She is overall doing better but still has some achiness. She has been using the bone stimulator. Date of injury was 04/22/2016. Pain is worse with activity.    Review of Systems Negative except for history of present illness  Objective: Vital Signs: There were no vitals taken for this visit.  Physical Exam Well-developed well-nourished no acute distress alert and 3 Ortho Exam Exam is stable of the pelvis. Specialty Comments:  No specialty comments available.  Imaging: No results found.   PMFS History: Patient Active Problem List   Diagnosis Date Noted  . Closed nondisplaced zone I fracture of  sacrum (HCC) 07/17/2016   Past Medical History:  Diagnosis Date  . Arthritis   . DDD (degenerative disc disease)   . Medical history non-contributory   . PONV (postoperative nausea and vomiting)     No family history on file.  Past Surgical History:  Procedure Laterality Date  . BREAST SURGERY     lt br cyst  . CARPAL TUNNEL WITH CUBITAL TUNNEL  7/14   left  . CERVICAL FUSION  2012  . COLONOSCOPY    . ULNAR TUNNEL RELEASE Right 04/13/2014   Procedure: RIGHT CUBITAL TUNNEL RELEASE INSITU VS TRANS POSTION RIGHT ON THE NERVE RELEASE AT WRIST GUYONS CANAL;  Surgeon: Dominica SeverinWilliam Gramig, MD;  Location: [facility];  Service: Orthopedics;  Laterality: Right;  at elbow and at hand   Social History   Occupational History  . Not on file.   Social History Main Topics  . Smoking status: Former Smoker    Quit date: 04/07/2007  . Smokeless tobacco: Not on file  . Alcohol use No     Comment: not since 2008  . Drug use: No  . Sexual activity: Not on file

## 2016-08-29 ENCOUNTER — Telehealth (INDEPENDENT_AMBULATORY_CARE_PROVIDER_SITE_OTHER): Payer: Self-pay | Admitting: Orthopaedic Surgery

## 2016-08-29 NOTE — Telephone Encounter (Signed)
Pt requesting if we could change her btw note to state 09/09/16 instead of 09/10/16 due to the plant opening back up on this day. Pt number is [phone]

## 2016-08-29 NOTE — Telephone Encounter (Signed)
Please advise 

## 2016-08-30 NOTE — Telephone Encounter (Signed)
yes

## 2016-09-03 NOTE — Telephone Encounter (Signed)
Work note ready for pick up at the front desk. Pt aware

## 2016-12-22 ENCOUNTER — Telehealth (INDEPENDENT_AMBULATORY_CARE_PROVIDER_SITE_OTHER): Payer: Self-pay | Admitting: Orthopaedic Surgery

## 2016-12-22 NOTE — Telephone Encounter (Signed)
See message.

## 2016-12-22 NOTE — Telephone Encounter (Signed)
5

## 2016-12-22 NOTE — Telephone Encounter (Signed)
PT CALLED AND ASKED ABOUT RATING FOR HER WC. SHE SAID SOMETHING WAS SENT OVER BUT THERE WAS NO RESPONSE.  PLEASE ADVISE. STATES SHE NEEDS THIS FOR A SETTLEMENT.  [phone]

## 2016-12-23 NOTE — Telephone Encounter (Signed)
Completed 25r form for pt. LVM for her to call me back if there is someone specific  I need to send it to.

## 2016-12-23 NOTE — Telephone Encounter (Signed)
Is there a form that needs to be filled out?

## 2016-12-24 NOTE — Telephone Encounter (Signed)
Mailed to [employer] [address] [address] [place] [address] [id number]

## 2017-03-22 ENCOUNTER — Ambulatory Visit (INDEPENDENT_AMBULATORY_CARE_PROVIDER_SITE_OTHER): Payer: 59

## 2017-03-22 ENCOUNTER — Other Ambulatory Visit: Payer: Self-pay

## 2017-03-22 ENCOUNTER — Encounter (HOSPITAL_COMMUNITY): Payer: Self-pay | Admitting: Emergency Medicine

## 2017-03-22 ENCOUNTER — Ambulatory Visit (HOSPITAL_COMMUNITY)
Admission: EM | Admit: 2017-03-22 | Discharge: 2017-03-22 | Disposition: A | Discharge status: Home / Self Care | Payer: 59 | Attending: Internal Medicine | Admitting: Internal Medicine

## 2017-03-22 DIAGNOSIS — R0789 Other chest pain: Secondary | ICD-10-CM

## 2017-03-22 DIAGNOSIS — R079 Chest pain, unspecified: Secondary | ICD-10-CM

## 2017-03-22 DIAGNOSIS — Z87891 Personal history of nicotine dependence: Secondary | ICD-10-CM | POA: Diagnosis not present

## 2017-03-22 DIAGNOSIS — R05 Cough: Secondary | ICD-10-CM | POA: Diagnosis not present

## 2017-03-22 MED ORDER — IPRATROPIUM-ALBUTEROL 0.5-2.5 (3) MG/3ML IN SOLN
3.0000 mL | Freq: Once | RESPIRATORY_TRACT | Status: AC
  Administered 2017-03-22: 3 mL via RESPIRATORY_TRACT

## 2017-03-22 MED ORDER — IPRATROPIUM-ALBUTEROL 0.5-2.5 (3) MG/3ML IN SOLN
RESPIRATORY_TRACT | Status: AC
  Filled 2017-03-22: qty 3

## 2017-03-22 MED ORDER — PREDNISONE 50 MG PO TABS: 50.0000 mg | ORAL_TABLET | Freq: Every day | ORAL | 0 refills | Status: AC

## 2017-03-22 MED ORDER — ALBUTEROL SULFATE HFA 108 (90 BASE) MCG/ACT IN AERS: 1.0000 | INHALATION_SPRAY | Freq: Four times a day (QID) | RESPIRATORY_TRACT | 0 refills | Status: AC | PRN

## 2017-03-22 NOTE — ED Triage Notes (Signed)
Pt reports left lower chest and back pain that has been going on for several weeks.  Pt denies any other symptoms related to the pain.  It is a constant pain that she states she feels is coming from her lung.  She reports a lengthy family history of cancer.

## 2017-03-22 NOTE — Discharge Instructions (Signed)
If you pain gets worse or is not improving then please go directly to the ED.

## 2017-03-22 NOTE — ED Provider Notes (Signed)
CSN: [id number]     Arrival date & time 03/22/17  1321 History   None    Chief Complaint  Patient presents with  . Chest Pain   (Consider location/radiation/quality/duration/timing/severity/associated sxs/prior Treatment) HPI  Chest pain that started after coughing that started after a coughing spell that started about 2 weeks ago.  The pain is about the left lower ribs.  Says that she is "not getting a full breath." The pain is worse with deep breathing.  No dysuria.  Denies diaphoresis, radicular pattern, dizziness with the pain.  She does have a thirty pack year history of smoking.   Past Medical History:  Diagnosis Date  . Arthritis   . DDD (degenerative disc disease)   . Medical history non-contributory   . PONV (postoperative nausea and vomiting)    Past Surgical History:  Procedure Laterality Date  . BREAST SURGERY     lt br cyst  . CARPAL TUNNEL WITH CUBITAL TUNNEL  7/14   left  . CERVICAL FUSION  2012  . COLONOSCOPY    . ULNAR TUNNEL RELEASE Right 04/13/2014   Procedure: RIGHT CUBITAL TUNNEL RELEASE INSITU VS TRANS POSTION RIGHT ON THE NERVE RELEASE AT WRIST GUYONS CANAL;  Surgeon: Dominica SeverinWilliam Gramig, MD;  Location: [facility];  Service: Orthopedics;  Laterality: Right;  at elbow and at hand   Family History  Problem Relation Age of Onset  . Hypertension Mother   . Alcoholism Father   . Cancer Sister        breast  . Cancer Brother        colon   Social History  Substance Use Topics  . Smoking status: Former Smoker    Quit date: 04/07/2007  . Smokeless tobacco: Never Used  . Alcohol use No     Comment: not since 2008   OB History    No data available     Review of Systems  Constitutional: Negative for activity change and diaphoresis.  Respiratory: Negative for chest tightness, shortness of breath and wheezing.   Cardiovascular: Positive for chest pain. Negative for leg swelling.  Gastrointestinal: Negative for abdominal pain.  Genitourinary:  Negative.   Neurological: Negative for dizziness.    Allergies  Ibuprofen; Shellfish allergy; and Latex  Home Medications   Prior to Admission medications   Medication Sig Start Date End Date Taking? Authorizing Provider  estradiol (VIVELLE-DOT) 0.025 MG/24HR Place 1 patch onto the skin 2 (two) times a week.   Yes [provider]  albuterol (PROVENTIL HFA;VENTOLIN HFA) 108 (90 Base) MCG/ACT inhaler Inhale 1-2 puffs into the lungs every 6 (six) hours as needed for wheezing or shortness of breath. 03/22/17   Ofilia Neaslark, Keelon Zurn L, PA-C  b complex vitamins capsule Take 1 capsule by mouth daily.    [provider]  cholecalciferol (VITAMIN D) 1000 UNITS tablet Take 1,000 Units by mouth daily.    [provider]  Omega-3 Fatty Acids (FISH OIL) 1000 MG CAPS Take by mouth.    [provider]  predniSONE (DELTASONE) 50 MG tablet Take 1 tablet (50 mg total) by mouth daily with breakfast. 03/22/17 03/27/17  Ofilia Neaslark, Naydene Kamrowski L, PA-C   Meds Ordered and Administered this Visit   Medications  ipratropium-albuterol (DUONEB) 0.5-2.5 (3) MG/3ML nebulizer solution 3 mL (3 mLs Nebulization Given 03/22/17 1456)    BP 126/72 (BP Location: Right Arm)   Pulse 63   Temp 98.9 F (37.2 C) (Oral)   SpO2 100%  No data found.  Physical Exam  Constitutional: She is oriented to person, place, and time. She appears well-developed and well-nourished. No distress.  Eyes: Pupils are equal, round, and reactive to light. EOM are normal.  Cardiovascular: Normal rate, regular rhythm, normal heart sounds and intact distal pulses.  Exam reveals no gallop and no friction rub.   No murmur heard. Pulmonary/Chest: Effort normal. No respiratory distress. She has no wheezes. She has no rales. She exhibits no tenderness.  Breath sounds somewhat tight initially, however this improved with duo neb x 1.   Abdominal: She exhibits no distension.  Musculoskeletal: She exhibits no edema, tenderness or  deformity.  Neurological: She is alert and oriented to person, place, and time. No cranial nerve deficit. Gait normal.  Skin: Skin is dry. She is not diaphoretic.  Psychiatric: She has a normal mood and affect.  Vitals reviewed.   Urgent Care Course     ED EKG Date/Time: 03/22/2017 2:33 PM Performed by: Deliah Boston L Authorized by: Deliah Boston L   Interpretation:    Interpretation: abnormal   Rate:    ECG rate assessment: bradycardic   Rhythm:    Rhythm: sinus bradycardia   Ectopy:    Ectopy: none   QRS:    QRS axis:  Normal Conduction:    Conduction: normal   ST segments:    ST segments:  Normal T waves:    T waves: normal      (including critical care time)  Labs Review Labs Reviewed - No data to display  Imaging Review Dg Chest 2 View  Result Date: 03/22/2017 CLINICAL DATA:  Worsening left upper chest pain. Some cough. Ex-smoker. EXAM: CHEST  2 VIEW COMPARISON:  10/14/2010. FINDINGS: Normal sized heart. Clear lungs. Mild thoracic spine degenerative changes. IMPRESSION: No acute abnormality. Electronically Signed   By: Beckie Salts M.D.   On: 03/22/2017 14:39     MDM   1. Chest wall pain   2. History of smoking 30 or more pack years   3. Nonspecific chest pain    EKG and chest rads normal.  She is non cardiac like symptoms and this pain started after coughing.  This is most likely MSK pain caused by coughing and/or a mild and early COPD exacerbation given her history of extensive smoking.  Prednisone should help both.  Rescue inhaler to improve any acute cough.     Ofilia Neas, PA-C 03/22/17 1519

## 2017-09-14 DIAGNOSIS — M79641 Pain in right hand: Secondary | ICD-10-CM | POA: Diagnosis not present

## 2017-09-24 DIAGNOSIS — M79641 Pain in right hand: Secondary | ICD-10-CM | POA: Diagnosis not present

## 2017-10-01 DIAGNOSIS — M1811 Unilateral primary osteoarthritis of first carpometacarpal joint, right hand: Secondary | ICD-10-CM | POA: Diagnosis not present

## 2017-10-29 DIAGNOSIS — M13841 Other specified arthritis, right hand: Secondary | ICD-10-CM | POA: Diagnosis not present

## 2017-11-10 DIAGNOSIS — M84374A Stress fracture, right foot, initial encounter for fracture: Secondary | ICD-10-CM | POA: Diagnosis not present

## 2017-11-12 ENCOUNTER — Ambulatory Visit: Payer: 59 | Admitting: Podiatry

## 2017-11-30 DIAGNOSIS — M84371D Stress fracture, right ankle, subsequent encounter for fracture with routine healing: Secondary | ICD-10-CM | POA: Diagnosis not present

## 2017-11-30 DIAGNOSIS — M79671 Pain in right foot: Secondary | ICD-10-CM | POA: Diagnosis not present

## 2018-05-03 ENCOUNTER — Ambulatory Visit (INDEPENDENT_AMBULATORY_CARE_PROVIDER_SITE_OTHER): Payer: 59

## 2018-05-03 ENCOUNTER — Encounter (HOSPITAL_COMMUNITY): Payer: Self-pay | Admitting: Emergency Medicine

## 2018-05-03 ENCOUNTER — Ambulatory Visit (HOSPITAL_COMMUNITY)
Admission: EM | Admit: 2018-05-03 | Discharge: 2018-05-03 | Disposition: A | Discharge status: Home / Self Care | Payer: 59 | Attending: Family Medicine | Admitting: Family Medicine

## 2018-05-03 DIAGNOSIS — R2242 Localized swelling, mass and lump, left lower limb: Secondary | ICD-10-CM | POA: Diagnosis not present

## 2018-05-03 DIAGNOSIS — M79672 Pain in left foot: Secondary | ICD-10-CM | POA: Diagnosis not present

## 2018-05-03 NOTE — ED Provider Notes (Signed)
[facility]    CSN: [id number] Arrival date & time: 05/03/18  1738     History   Chief Complaint Chief Complaint  Patient presents with  . Foot Pain    HPI Kathy Mcdaniel is a 57 y.o. female.   HPI  Patient runs for exercise.  She states that she ran 4 miles yesterday.  When she got back to her home, she noticed that there was a swollen area that was a bruise on the top of her left foot.  The swelling is gone down but the bruising is increased, dissipated all across the top of her foot.  She has pain at the distal fourth metatarsal.  She is worried because she had a metatarsal stress fracture on her right foot recently.  She thinks that she may have another stress fracture.  She wore a steel toe shoe to work today, and it was painful for her.  She is quite a limp.  She is here for evaluation of left foot pain.  She is postmenopausal.  She does not think she has osteoporosis.  Told her to discuss with her personal physician bone density testing  Past Medical History:  Diagnosis Date  . Arthritis   . DDD (degenerative disc disease)   . Medical history non-contributory   . PONV (postoperative nausea and vomiting)     Patient Active Problem List   Diagnosis Date Noted  . Closed nondisplaced zone I fracture of sacrum (HCC) 07/17/2016    Past Surgical History:  Procedure Laterality Date  . BREAST SURGERY     lt br cyst  . CARPAL TUNNEL WITH CUBITAL TUNNEL  7/14   left  . CERVICAL FUSION  2012  . COLONOSCOPY    . ULNAR TUNNEL RELEASE Right 04/13/2014   Procedure: RIGHT CUBITAL TUNNEL RELEASE INSITU VS TRANS POSTION RIGHT ON THE NERVE RELEASE AT WRIST GUYONS CANAL;  Surgeon: Dominica Severin, MD;  Location: [facility];  Service: Orthopedics;  Laterality: Right;  at elbow and at hand    OB History   None      Home Medications    Prior to Admission medications   Medication Sig Start Date End Date Taking? Authorizing Provider  albuterol (PROVENTIL  HFA;VENTOLIN HFA) 108 (90 Base) MCG/ACT inhaler Inhale 1-2 puffs into the lungs every 6 (six) hours as needed for wheezing or shortness of breath. 03/22/17   Ofilia Neas, PA-C  b complex vitamins capsule Take 1 capsule by mouth daily.    [provider]  cholecalciferol (VITAMIN D) 1000 UNITS tablet Take 1,000 Units by mouth daily.    [provider]  estradiol (VIVELLE-DOT) 0.025 MG/24HR Place 1 patch onto the skin 2 (two) times a week.    [provider]  Omega-3 Fatty Acids (FISH OIL) 1000 MG CAPS Take by mouth.    [provider]    Family History Family History  Problem Relation Age of Onset  . Hypertension Mother   . Alcoholism Father   . Cancer Sister        breast  . Cancer Brother        colon    Social History Social History   Tobacco Use  . Smoking status: Former Smoker    Last attempt to quit: 04/07/2007    Years since quitting: 11.0  . Smokeless tobacco: Never Used  Substance Use Topics  . Alcohol use: No    Comment: not since 2008  . Drug use: No  Allergies   Ibuprofen; Shellfish allergy; and Latex   Review of Systems Review of Systems  Constitutional: Negative for chills and fever.  HENT: Negative for ear pain and sore throat.   Eyes: Negative for pain and visual disturbance.  Respiratory: Negative for cough and shortness of breath.   Cardiovascular: Negative for chest pain and palpitations.  Gastrointestinal: Negative for abdominal pain and vomiting.  Genitourinary: Negative for dysuria and hematuria.  Musculoskeletal: Positive for gait problem. Negative for arthralgias and back pain.  Skin: Negative for color change and rash.  Neurological: Negative for seizures and syncope.  All other systems reviewed and are negative.    Physical Exam Triage Vital Signs ED Triage Vitals [05/03/18 1808]  Enc Vitals Group     BP 124/76     Pulse Rate 70     Resp 16     Temp 98.2 F (36.8 C)     Temp Source Oral      SpO2 100 %     Weight      Height      Head Circumference      Peak Flow      Pain Score      Pain Loc      Pain Edu?      Excl. in GC?    No data found.  Updated Vital Signs BP 124/76 (BP Location: Right Arm)   Pulse 70   Temp 98.2 F (36.8 C) (Oral)   Resp 16   SpO2 100%   Visual Acuity Right Eye Distance:   Left Eye Distance:   Bilateral Distance:    Right Eye Near:   Left Eye Near:    Bilateral Near:     Physical Exam  Constitutional: She appears well-developed and well-nourished. No distress.  HENT:  Head: Normocephalic and atraumatic.  Mouth/Throat: Oropharynx is clear and moist.  Eyes: Pupils are equal, round, and reactive to light. Conjunctivae are normal.  Neck: Normal range of motion.  Cardiovascular: Normal rate.  Pulmonary/Chest: Effort normal. No respiratory distress.  Abdominal: Soft. She exhibits no distension.  Musculoskeletal: Normal range of motion. She exhibits no edema.       Feet:  Neurological: She is alert.  Skin: Skin is warm and dry.     UC Treatments / Results  Labs (all labs ordered are listed, but only abnormal results are displayed) Labs Reviewed - No data to display  EKG None  Radiology Dg Foot Complete Left  Result Date: 05/03/2018 CLINICAL DATA:  Mass at the base of the left toes after jogging yesterday. This turn into a large bruise in the dorsum of the foot today. History of stress fractures of the right foot. EXAM: LEFT FOOT - COMPLETE 3+ VIEW COMPARISON:  None. FINDINGS: There is no evidence of fracture or dislocation. There is no evidence of arthropathy or other focal bone abnormality. Soft tissues are unremarkable. IMPRESSION: Negative. Electronically Signed   By: Beckie Salts M.D.   On: 05/03/2018 18:56    Procedures Procedures (including critical care time)  Medications Ordered in UC Medications - No data to display  Initial Impression / Assessment and Plan / UC Course  I have reviewed the triage vital signs  and the nursing notes.  Pertinent labs & imaging results that were available during my care of the patient were reviewed by me and considered in my medical decision making (see chart for details).    Discussed with patient that metatarsal strips fractures are notoriously difficult to diagnose  when acute.  She might need a bone scan or CT of some sort.  She should follow-up with her orthopedic surgeon if her pain fails to improve with conservative treatment. Final Clinical Impressions(s) / UC Diagnoses   Final diagnoses:  Foot pain, left     Discharge Instructions     Take ibuprofen or aleve as needed pain Limit running while foot painful See your ortho if you fail to get better in a week or two   ED Prescriptions    None     Controlled Substance Prescriptions [place] Controlled Substance Registry consulted? Not Applicable   Eustace MooreNelson, Janequa Kipnis Sue, MD 05/03/18 2115

## 2018-05-03 NOTE — Discharge Instructions (Addendum)
Take ibuprofen or aleve as needed pain Limit running while foot painful See your ortho if you fail to get better in a week or two

## 2018-05-03 NOTE — ED Triage Notes (Signed)
Pt sts left foot pain after running yesterday and feeling area "itching"; pt sts hx of stress fracture to right foot

## 2018-08-10 ENCOUNTER — Other Ambulatory Visit: Payer: Self-pay | Admitting: Obstetrics and Gynecology

## 2018-08-10 DIAGNOSIS — Z1231 Encounter for screening mammogram for malignant neoplasm of breast: Secondary | ICD-10-CM

## 2018-09-22 ENCOUNTER — Ambulatory Visit
Admission: RE | Admit: 2018-09-22 | Discharge: 2018-09-22 | Disposition: A | Discharge status: Home / Self Care | Payer: 59 | Source: Ambulatory Visit | Attending: Obstetrics and Gynecology | Admitting: Obstetrics and Gynecology

## 2018-09-22 DIAGNOSIS — Z1231 Encounter for screening mammogram for malignant neoplasm of breast: Secondary | ICD-10-CM

## 2019-02-22 ENCOUNTER — Other Ambulatory Visit: Payer: Self-pay

## 2019-02-22 ENCOUNTER — Ambulatory Visit (INDEPENDENT_AMBULATORY_CARE_PROVIDER_SITE_OTHER): Payer: 59 | Admitting: Orthopaedic Surgery

## 2019-02-22 ENCOUNTER — Encounter: Payer: Self-pay | Admitting: Orthopaedic Surgery

## 2019-02-22 ENCOUNTER — Ambulatory Visit: Payer: Self-pay

## 2019-02-22 DIAGNOSIS — M545 Low back pain, unspecified: Secondary | ICD-10-CM

## 2019-02-22 MED ORDER — PREDNISONE 10 MG (21) PO TBPK: ORAL_TABLET | ORAL | 0 refills | Status: AC

## 2019-02-22 MED ORDER — METHOCARBAMOL 500 MG PO TABS: 500.0000 mg | ORAL_TABLET | Freq: Every evening | ORAL | 0 refills | Status: AC | PRN

## 2019-02-22 NOTE — Progress Notes (Signed)
Office Visit Note   Patient: Kathy Mcdaniel           Date of Birth: Aug 09, 1961           MRN: [id number] Visit Date: 02/22/2019              Requested by: Alroy Dust, L.Marlou Sa, [place] [address] [place],  [place] [address] PCP: Alroy Dust, L.Marlou Sa, MD   Assessment & Plan: Visit Diagnoses:  1. Acute left-sided low back pain, unspecified whether sciatica present     Plan: Impression is lumbar degenerative disc disease L5-S1.  I will start the patient on a Sterapred taper and muscle relaxers.  We will send her to formal physical therapy.  A prescription was provided for this today.  Follow-up with [place] in 8 weeks time for repeat evaluation and possible MRI should her symptoms not improve.  Follow-Up Instructions: Return in about 8 weeks (around 04/19/2019).   Orders:  Orders Placed This Encounter  Procedures  . XR Lumbar Spine 2-3 Views   Meds ordered this encounter  Medications  . predniSONE (STERAPRED UNI-PAK 21 TAB) 10 MG (21) TBPK tablet    Sig: Take as directed    Dispense:  21 tablet    Refill:  0  . methocarbamol (ROBAXIN) 500 MG tablet    Sig: Take 1 tablet (500 mg total) by mouth at bedtime as needed for muscle spasms.    Dispense:  14 tablet    Refill:  0      Procedures: No procedures performed   Clinical Data: No additional findings.   Subjective: Chief Complaint  Patient presents with  . Lower Back - Pain    HPI patient is a pleasant 58 year old female who presents our clinic today with left lower back pain.  This has been ongoing for the past several months and is recently worsened.  No new injury or change in activity.  Of note, she is approximately 6 months out from a sacral fracture.  She states that her sacral pain has resolved and this new pain is much different.  The pain she is now getting is to the left lower back and into the left buttocks.  She describes this as a constant pain which is worse when she is standing too long in 1 spot.   She has been taking BC powder and Advil with mild relief of symptoms.  She denies any weakness or numbness, tingling or burning to either lower extremity.  No bowel or bladder change and no saddle paresthesias.  No previous epidural steroid injection or surgical injections or lumbar spine.  She does note that she has had cervical spine surgery by Dr. Lorin Mercy in the past.  Review of Systems as detailed in HPI.  All others reviewed and are negative.   Objective: Vital Signs: There were no vitals taken for this visit.  Physical Exam well-developed well-nourished female no acute distress.  Alert and oriented x3.  Ortho Exam examination of her lumbar spine reveals no spinous tenderness.  She does have mild paraspinous tenderness the left lower lumbar spine.  Increased pain with rotation to the left.  Positive straight leg raise.  Negative logroll.  No focal weakness.  She is neurovascularly intact distally.  Specialty Comments:  No specialty comments available.  Imaging: Xr Lumbar Spine 2-3 Views  Result Date: 02/22/2019 X-rays demonstrate marked degenerative disc disease and facet arthropathy at L5-S1    PMFS History: Patient Active Problem List   Diagnosis Date Noted  .  Closed nondisplaced zone I fracture of sacrum (HCC) 07/17/2016   Past Medical History:  Diagnosis Date  . Arthritis   . DDD (degenerative disc disease)   . Medical history non-contributory   . PONV (postoperative nausea and vomiting)     Family History  Problem Relation Age of Onset  . Hypertension Mother   . Alcoholism Father   . Cancer Sister        breast  . Cancer Brother        colon    Past Surgical History:  Procedure Laterality Date  . BREAST SURGERY     lt br cyst  . CARPAL TUNNEL WITH CUBITAL TUNNEL  7/14   left  . CERVICAL FUSION  2012  . COLONOSCOPY    . ULNAR TUNNEL RELEASE Right 04/13/2014   Procedure: RIGHT CUBITAL TUNNEL RELEASE INSITU VS TRANS POSTION RIGHT ON THE NERVE RELEASE AT WRIST  GUYONS CANAL;  Surgeon: Dominica SeverinWilliam Gramig, MD;  Location: [facility];  Service: Orthopedics;  Laterality: Right;  at elbow and at hand   Social History   Occupational History  . Not on file  Tobacco Use  . Smoking status: Former Smoker    Quit date: 04/07/2007    Years since quitting: 11.8  . Smokeless tobacco: Never Used  Substance and Sexual Activity  . Alcohol use: No    Comment: not since 2008  . Drug use: No  . Sexual activity: Not on file

## 2020-09-21 ENCOUNTER — Ambulatory Visit: Payer: Self-pay

## 2020-09-21 ENCOUNTER — Ambulatory Visit: Payer: 59 | Admitting: Orthopaedic Surgery

## 2020-09-21 ENCOUNTER — Encounter: Payer: Self-pay | Admitting: Orthopaedic Surgery

## 2020-09-21 VITALS — Ht 69.0 in | Wt 135.0 lb

## 2020-09-21 DIAGNOSIS — Z981 Arthrodesis status: Secondary | ICD-10-CM

## 2020-09-21 DIAGNOSIS — M542 Cervicalgia: Secondary | ICD-10-CM | POA: Diagnosis not present

## 2020-09-21 NOTE — Progress Notes (Signed)
Office Visit Note   Patient: Kathy Mcdaniel           Date of Birth: 1960/12/14           MRN: [id number] Visit Date: 09/21/2020              Requested by: Clovis Riley, L.August Saucer, MD [address] [place],  [place] [address] PCP: Clovis Riley, L.August Saucer, MD   Assessment & Plan: Visit Diagnoses:  1. Neck pain   2. Hx of fusion of cervical spine     Plan: We discussed heat ice gentle range of motion exercises.  Patient develops myelopathic radicular symptoms she can return.  Radiographs were reviewed.  Follow-Up Instructions: No follow-ups on file.   Orders:  Orders Placed This Encounter  Procedures   XR Cervical Spine 2 or 3 views   No orders of the defined types were placed in this encounter.     Procedures: No procedures performed   Clinical Data: No additional findings.   Subjective: Chief Complaint  Patient presents with   Neck - Pain    HPI 60 year old female had two-level cervical fusion C4-5 and C5-6 in 2012 with good relief of preop pain.  She states 2 weeks ago she started having trouble bending her neck and had severe pain if she tries to extend her neck or flex her neck.  Pain radiated into her left shoulder.  She states the pain was severe like it was in 2012 when she thought she might have to go to urgent care.  She took muscle relaxants leftover from treatment for her back in the past as well as ibuprofen.  Patient states the  Is eased off since that time and is significantly less severe.  No falling.  No bowel bladder symptoms.  No gait disturbance.  Review of Systems past history of closed zone 1 fracture of the sacrum.   Objective: Vital Signs: Ht 5\' 9"  (1.753 m)    Wt 135 lb (61.2 kg)    BMI 19.94 kg/m   Physical Exam Constitutional:      Appearance: She is well-developed.  HENT:     Head: Normocephalic.     Right Ear: External ear normal.     Left Ear: External ear normal.  Eyes:     Pupils: Pupils are equal, round, and reactive  to light.  Neck:     Thyroid: No thyromegaly.     Trachea: No tracheal deviation.  Cardiovascular:     Rate and Rhythm: Normal rate.  Pulmonary:     Effort: Pulmonary effort is normal.  Abdominal:     Palpations: Abdomen is soft.  Skin:    General: Skin is warm and dry.  Neurological:     Mental Status: She is alert and oriented to person, place, and time.  Psychiatric:        Mood and Affect: Mood and affect normal.        Behavior: Behavior normal.     Ortho Exam patient has mild discomfort cervical compression.  Minimal brachial plexus tenderness some tenderness of the trapezial muscle.  Reflexes are 2+ and symmetrical no lower extremity clonus no hyperreflexia lower extremities.  Specialty Comments:  No specialty comments available.  Imaging: No results found.   PMFS History: Patient Active Problem List   Diagnosis Date Noted   Hx of fusion of cervical spine 09/28/2020   Closed nondisplaced zone I fracture of sacrum (HCC) 07/17/2016   Past Medical History:  Diagnosis Date  Arthritis    DDD (degenerative disc disease)    Medical history non-contributory    PONV (postoperative nausea and vomiting)     Family History  Problem Relation Age of Onset   Hypertension Mother    Alcoholism Father    Cancer Sister        breast   Cancer Brother        colon    Past Surgical History:  Procedure Laterality Date   BREAST SURGERY     lt br cyst   CARPAL TUNNEL WITH CUBITAL TUNNEL  7/14   left   CERVICAL FUSION  2012   COLONOSCOPY     ULNAR TUNNEL RELEASE Right 04/13/2014   Procedure: RIGHT CUBITAL TUNNEL RELEASE INSITU VS TRANS POSTION RIGHT ON THE NERVE RELEASE AT WRIST GUYONS CANAL;  Surgeon: Dominica Severin, MD;  Location: [facility];  Service: Orthopedics;  Laterality: Right;  at elbow and at hand   Social History   Occupational History   Not on file  Tobacco Use   Smoking status: Former Smoker    Quit date: 04/07/2007     Years since quitting: 13.4   Smokeless tobacco: Never Used  Substance and Sexual Activity   Alcohol use: No    Comment: not since 2008   Drug use: No   Sexual activity: Not on file

## 2020-09-28 DIAGNOSIS — Z981 Arthrodesis status: Secondary | ICD-10-CM | POA: Insufficient documentation

## 2021-04-29 ENCOUNTER — Other Ambulatory Visit: Payer: Self-pay | Admitting: Obstetrics and Gynecology

## 2021-12-12 ENCOUNTER — Other Ambulatory Visit: Payer: Self-pay | Admitting: Obstetrics and Gynecology

## 2021-12-12 DIAGNOSIS — Z1231 Encounter for screening mammogram for malignant neoplasm of breast: Secondary | ICD-10-CM

## 2021-12-17 ENCOUNTER — Ambulatory Visit
Admission: RE | Admit: 2021-12-17 | Discharge: 2021-12-17 | Disposition: A | Discharge status: Home / Self Care | Payer: 59 | Source: Ambulatory Visit | Attending: Obstetrics and Gynecology | Admitting: Obstetrics and Gynecology

## 2021-12-17 DIAGNOSIS — Z1231 Encounter for screening mammogram for malignant neoplasm of breast: Secondary | ICD-10-CM

## 2021-12-19 ENCOUNTER — Other Ambulatory Visit: Payer: Self-pay | Admitting: Obstetrics and Gynecology

## 2021-12-19 DIAGNOSIS — R928 Other abnormal and inconclusive findings on diagnostic imaging of breast: Secondary | ICD-10-CM

## 2022-02-21 ENCOUNTER — Other Ambulatory Visit: Payer: Self-pay | Admitting: Obstetrics and Gynecology

## 2022-02-21 ENCOUNTER — Ambulatory Visit
Admission: RE | Admit: 2022-02-21 | Discharge: 2022-02-21 | Disposition: A | Discharge status: Home / Self Care | Payer: 59 | Source: Ambulatory Visit | Attending: Obstetrics and Gynecology | Admitting: Obstetrics and Gynecology

## 2022-02-21 DIAGNOSIS — R928 Other abnormal and inconclusive findings on diagnostic imaging of breast: Secondary | ICD-10-CM

## 2022-02-21 DIAGNOSIS — R921 Mammographic calcification found on diagnostic imaging of breast: Secondary | ICD-10-CM

## 2022-09-30 IMAGING — MG DIGITAL DIAGNOSTIC UNILAT LEFT W/ CAD
6 series · 6 of 6 positions shown · non-contrast
Comparison: Previous exam(s).

CLINICAL DATA: Recall from screening mammography, calcifications
involving the outer LEFT breast at far posterior depth.

EXAM:
DIGITAL DIAGNOSTIC UNILATERAL LEFT MAMMOGRAM WITH CAD
TECHNIQUE: Left digital diagnostic mammography was performed. Mammographic
images were processed with CAD.

[L ML (1 of 2)]
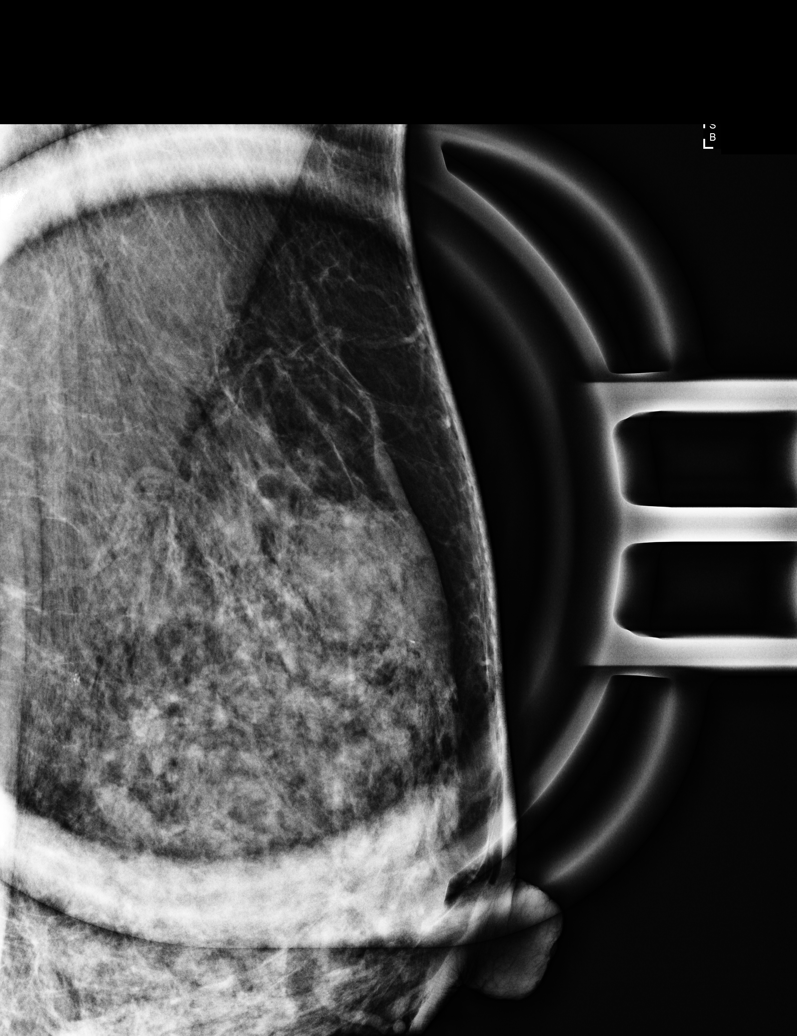

[L XCCL (1 of 4)]
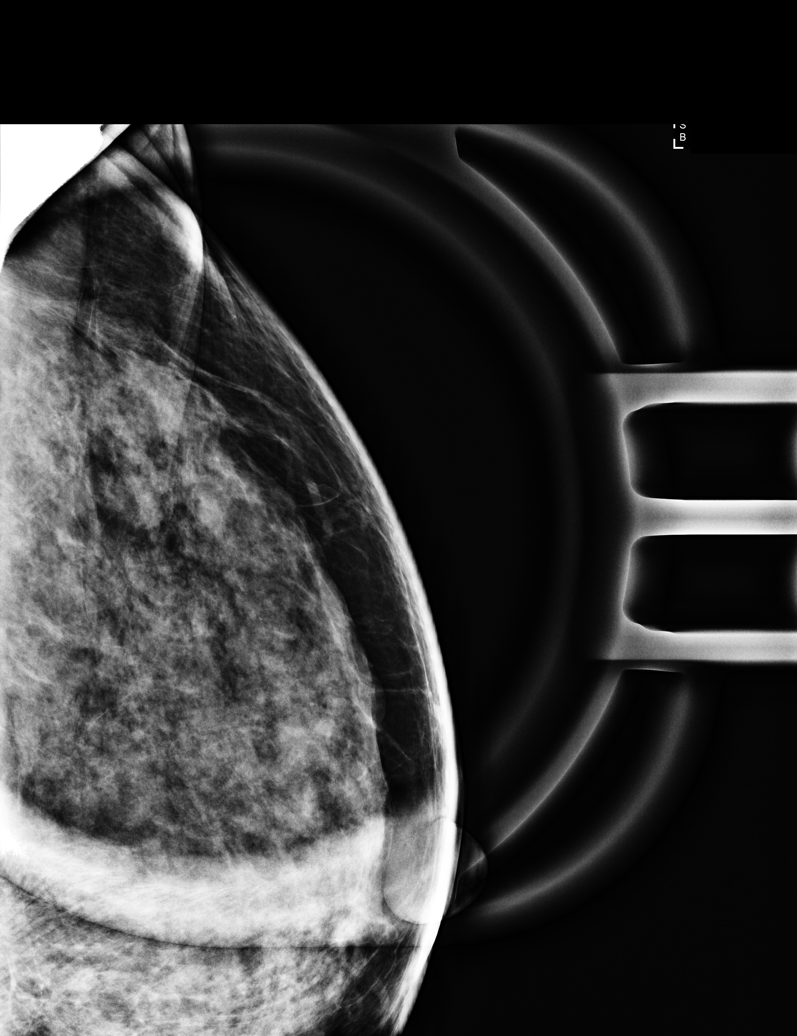

[L XCCL (2 of 4)]
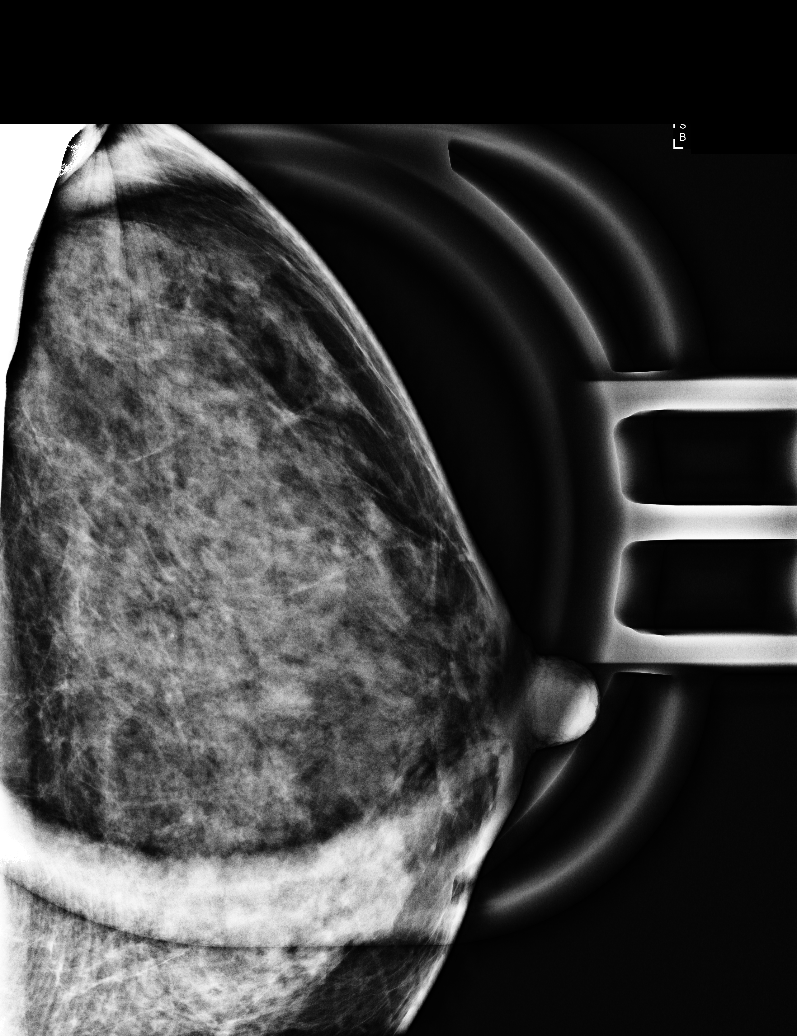

[L XCCL (3 of 4)]
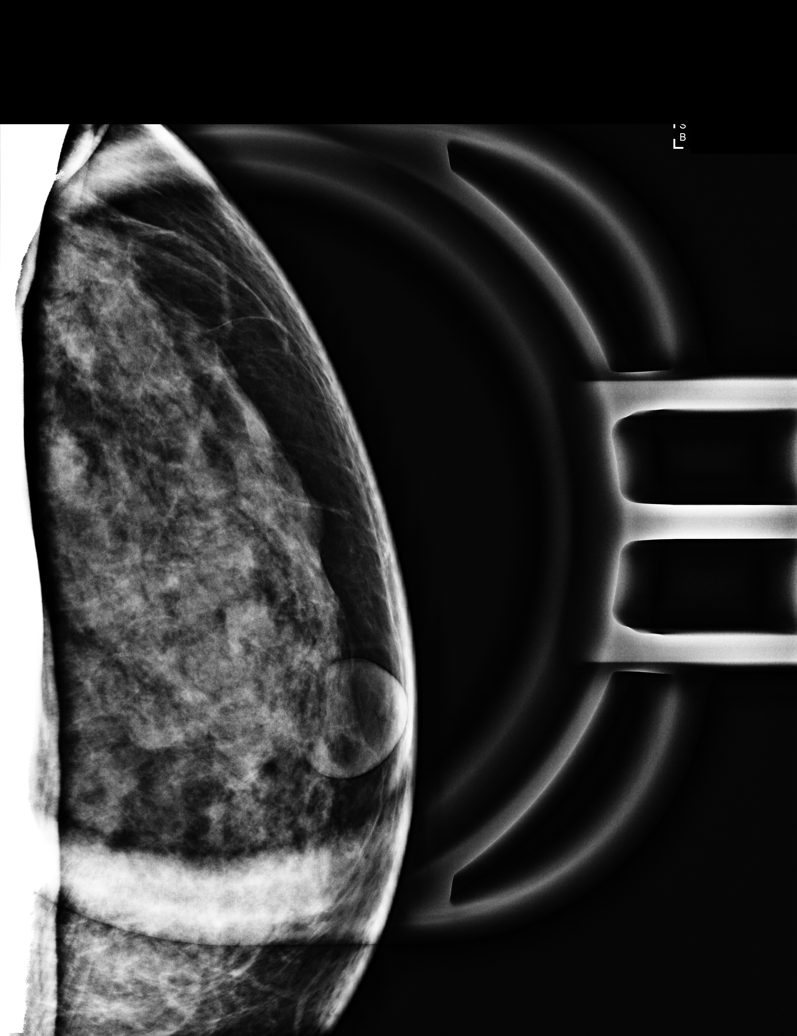

[L XCCL (4 of 4)]
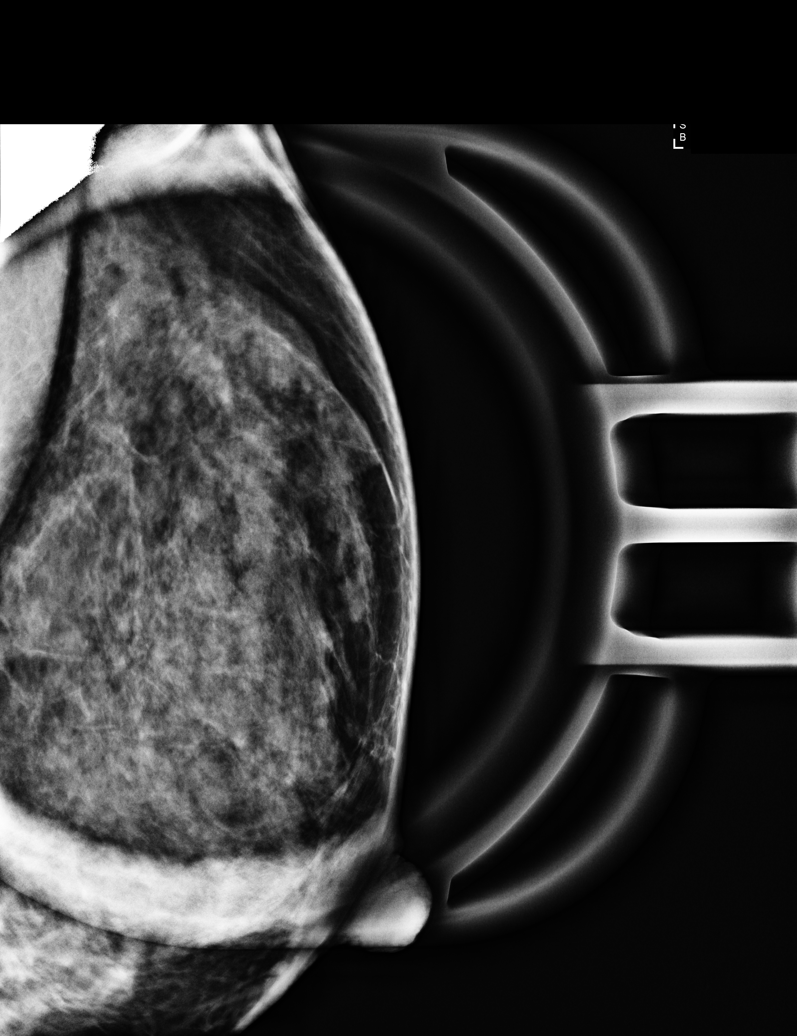

[L ML (2 of 2)]
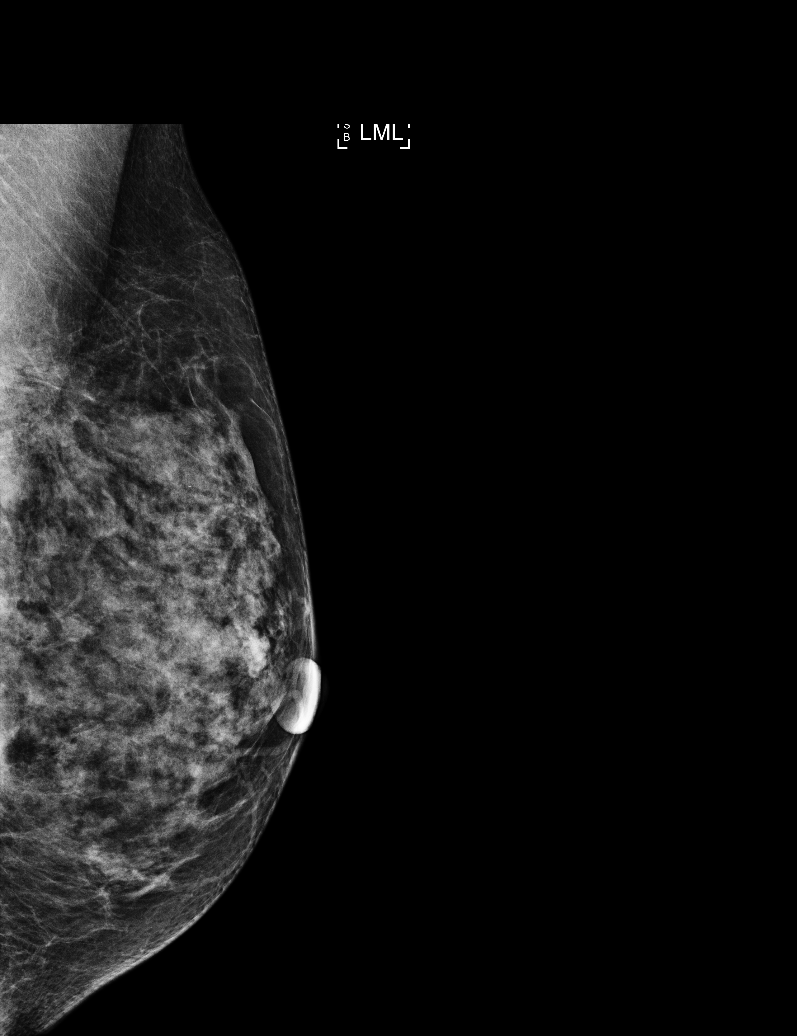

[6 of 6 positions shown; findings below may reference images not displayed]

ACR Breast Density Category d: The breast tissue is extremely dense,
which lowers the sensitivity of mammography.
FINDINGS: Spot magnification laterally exaggerated CC and mediolateral views
of the calcifications and a full field mediolateral view were
obtained.

There is a 2 mm group of faint round calcifications involving the
outer breast at far posterior depth, slight UPPER OUTER QUADRANT.
There are no suspicious linear or branching forms. The
calcifications were not seen on the full field mediolateral view due
to their far posterior location.
IMPRESSION: Likely benign 2 mm group of calcifications involving the slight
UPPER OUTER QUADRANT of the LEFT breast at far posterior depth.

RECOMMENDATION:
Diagnostic LEFT mammogram to include spot magnification views of the
LEFT breast calcifications in 6 months.

I have discussed the findings and recommendations with the patient.
If applicable, a reminder letter will be sent to the patient
regarding the next appointment.

BI-RADS CATEGORY  3: Probably benign.

## 2023-10-19 ENCOUNTER — Other Ambulatory Visit: Payer: Self-pay | Admitting: Internal Medicine

## 2023-10-19 DIAGNOSIS — R921 Mammographic calcification found on diagnostic imaging of breast: Secondary | ICD-10-CM

## 2023-10-26 ENCOUNTER — Ambulatory Visit
Admission: RE | Admit: 2023-10-26 | Discharge: 2023-10-26 | Disposition: A | Discharge status: Home / Self Care | Payer: 59 | Source: Ambulatory Visit | Attending: Internal Medicine | Admitting: Internal Medicine

## 2023-10-26 DIAGNOSIS — R921 Mammographic calcification found on diagnostic imaging of breast: Secondary | ICD-10-CM

## 2024-09-29 ENCOUNTER — Other Ambulatory Visit: Payer: Self-pay | Admitting: Internal Medicine

## 2024-09-29 DIAGNOSIS — Z1231 Encounter for screening mammogram for malignant neoplasm of breast: Secondary | ICD-10-CM

## 2024-11-08 ENCOUNTER — Ambulatory Visit
